# Patient Record
Sex: Male | Born: 1965 | Race: White | Hispanic: No | Marital: Married | Smoking: Never smoker
Health system: Southern US, Community
[De-identification: ages and names within clinical notes are randomized; demographics above are authoritative.]

## PROBLEM LIST (undated history)

## (undated) DIAGNOSIS — S4992XA Unspecified injury of left shoulder and upper arm, initial encounter: Secondary | ICD-10-CM

## (undated) DIAGNOSIS — Z8379 Family history of other diseases of the digestive system: Secondary | ICD-10-CM

## (undated) DIAGNOSIS — I10 Essential (primary) hypertension: Secondary | ICD-10-CM

## (undated) HISTORY — PX: TONSILLECTOMY: SUR1361

## (undated) HISTORY — DX: Essential (primary) hypertension: I10

## (undated) HISTORY — DX: Unspecified injury of left shoulder and upper arm, initial encounter: S49.92XA

## (undated) HISTORY — PX: NO PAST SURGERIES: SHX2092

## (undated) HISTORY — DX: Family history of other diseases of the digestive system: Z83.79

---

## 2005-03-20 ENCOUNTER — Ambulatory Visit: Payer: Self-pay | Admitting: Pulmonary Disease

## 2006-03-26 ENCOUNTER — Ambulatory Visit: Payer: Self-pay | Admitting: Pulmonary Disease

## 2006-04-01 ENCOUNTER — Ambulatory Visit: Payer: Self-pay | Admitting: Pulmonary Disease

## 2007-04-21 ENCOUNTER — Ambulatory Visit: Payer: Self-pay | Admitting: Pulmonary Disease

## 2007-04-21 LAB — CONVERTED CEMR LAB
ALT: 36 units/L (ref 0–53)
AST: 21 units/L (ref 0–37)
Albumin: 4.1 g/dL (ref 3.5–5.2)
Alkaline Phosphatase: 80 units/L (ref 39–117)
BUN: 10 mg/dL (ref 6–23)
Basophils Absolute: 0.1 10*3/uL (ref 0.0–0.1)
Basophils Relative: 1 % (ref 0.0–1.0)
Bilirubin Urine: NEGATIVE
Bilirubin, Direct: 0.1 mg/dL (ref 0.0–0.3)
CO2: 30 meq/L (ref 19–32)
Calcium: 9.2 mg/dL (ref 8.4–10.5)
Chloride: 105 meq/L (ref 96–112)
Cholesterol: 193 mg/dL (ref 0–200)
Creatinine, Ser: 0.9 mg/dL (ref 0.4–1.5)
Eosinophils Absolute: 0.1 10*3/uL (ref 0.0–0.6)
Eosinophils Relative: 1.4 % (ref 0.0–5.0)
GFR calc Af Amer: 120 mL/min
GFR calc non Af Amer: 99 mL/min
Glucose, Bld: 106 mg/dL — ABNORMAL HIGH (ref 70–99)
HCT: 44.4 % (ref 39.0–52.0)
HDL: 42.3 mg/dL (ref >39.0)
Hemoglobin, Urine: NEGATIVE
Hemoglobin: 15.4 g/dL (ref 13.0–17.0)
Ketones, ur: NEGATIVE mg/dL
LDL Cholesterol: 135 mg/dL — ABNORMAL HIGH (ref 0–99)
Leukocytes, UA: NEGATIVE
Lymphocytes Relative: 21.5 % (ref 12.0–46.0)
MCHC: 34.8 g/dL (ref 30.0–36.0)
MCV: 89.5 fL (ref 78.0–100.0)
Monocytes Absolute: 0.5 10*3/uL (ref 0.2–0.7)
Monocytes Relative: 8.1 % (ref 3.0–11.0)
Neutro Abs: 3.9 10*3/uL (ref 1.4–7.7)
Neutrophils Relative %: 68 % (ref 43.0–77.0)
Nitrite: NEGATIVE
Platelets: 251 10*3/uL (ref 150–400)
Potassium: 4.2 meq/L (ref 3.5–5.1)
RBC: 4.96 M/uL (ref 4.22–5.81)
RDW: 12.4 % (ref 11.5–14.6)
Sodium: 141 meq/L (ref 135–145)
Specific Gravity, Urine: 1.01 (ref 1.000–1.03)
TSH: 1.13 microintl units/mL (ref 0.35–5.50)
Total Bilirubin: 1.1 mg/dL (ref 0.3–1.2)
Total CHOL/HDL Ratio: 4.6
Total Protein: 6.8 g/dL (ref 6.0–8.3)
Triglycerides: 81 mg/dL (ref 0–149)
Urine Glucose: NEGATIVE mg/dL
Urobilinogen, UA: 0.2 (ref 0.0–1.0)
VLDL: 16 mg/dL (ref 0–40)
WBC: 5.8 10*3/uL (ref 4.5–10.5)
pH: 8.5 — AB (ref 5.0–8.0)

## 2007-12-09 ENCOUNTER — Telehealth: Payer: Self-pay | Admitting: Pulmonary Disease

## 2007-12-10 ENCOUNTER — Telehealth: Payer: Self-pay | Admitting: Pulmonary Disease

## 2007-12-22 DIAGNOSIS — J309 Allergic rhinitis, unspecified: Secondary | ICD-10-CM

## 2007-12-22 DIAGNOSIS — J45909 Unspecified asthma, uncomplicated: Secondary | ICD-10-CM | POA: Insufficient documentation

## 2007-12-22 DIAGNOSIS — K219 Gastro-esophageal reflux disease without esophagitis: Secondary | ICD-10-CM

## 2008-03-30 ENCOUNTER — Ambulatory Visit: Payer: Self-pay | Admitting: Pulmonary Disease

## 2009-04-20 ENCOUNTER — Ambulatory Visit: Payer: Self-pay | Admitting: Pulmonary Disease

## 2009-05-25 ENCOUNTER — Ambulatory Visit: Payer: Self-pay | Admitting: Pulmonary Disease

## 2009-05-25 DIAGNOSIS — S4980XA Other specified injuries of shoulder and upper arm, unspecified arm, initial encounter: Secondary | ICD-10-CM

## 2009-05-25 DIAGNOSIS — S46909A Unspecified injury of unspecified muscle, fascia and tendon at shoulder and upper arm level, unspecified arm, initial encounter: Secondary | ICD-10-CM | POA: Insufficient documentation

## 2009-06-06 ENCOUNTER — Telehealth: Payer: Self-pay | Admitting: Pulmonary Disease

## 2009-06-12 ENCOUNTER — Telehealth (INDEPENDENT_AMBULATORY_CARE_PROVIDER_SITE_OTHER): Payer: Self-pay | Admitting: *Deleted

## 2009-06-29 ENCOUNTER — Encounter: Payer: Self-pay | Admitting: Pulmonary Disease

## 2010-04-25 ENCOUNTER — Ambulatory Visit: Payer: Self-pay | Admitting: Pulmonary Disease

## 2010-11-05 ENCOUNTER — Ambulatory Visit
Admission: RE | Admit: 2010-11-05 | Discharge: 2010-11-05 | Payer: Self-pay | Source: Home / Self Care | Attending: Pulmonary Disease | Admitting: Pulmonary Disease

## 2010-11-05 ENCOUNTER — Other Ambulatory Visit: Payer: Self-pay | Admitting: Pulmonary Disease

## 2010-11-05 ENCOUNTER — Encounter: Payer: Self-pay | Admitting: Pulmonary Disease

## 2010-11-05 LAB — HEPATIC FUNCTION PANEL
ALT: 21 U/L (ref 0–53)
AST: 22 U/L (ref 0–37)
Albumin: 4.5 g/dL (ref 3.5–5.2)
Alkaline Phosphatase: 78 U/L (ref 39–117)
Bilirubin, Direct: 0.2 mg/dL (ref 0.0–0.3)
Total Bilirubin: 0.8 mg/dL (ref 0.3–1.2)
Total Protein: 6.9 g/dL (ref 6.0–8.3)

## 2010-11-05 LAB — URINALYSIS, ROUTINE W REFLEX MICROSCOPIC
Bilirubin Urine: NEGATIVE
Ketones, ur: NEGATIVE
Leukocytes, UA: NEGATIVE
Nitrite: NEGATIVE
Specific Gravity, Urine: 1.015 (ref 1.000–1.030)
Total Protein, Urine: NEGATIVE
Urine Glucose: NEGATIVE
Urobilinogen, UA: 0.2 (ref 0.0–1.0)
pH: 5.5 (ref 5.0–8.0)

## 2010-11-05 LAB — BASIC METABOLIC PANEL
BUN: 12 mg/dL (ref 6–23)
CO2: 30 mEq/L (ref 19–32)
Calcium: 9.6 mg/dL (ref 8.4–10.5)
Chloride: 103 mEq/L (ref 96–112)
Creatinine, Ser: 1 mg/dL (ref 0.4–1.5)
GFR: 84.77 mL/min (ref >60.00)
Glucose, Bld: 90 mg/dL (ref 70–99)
Potassium: 4.4 mEq/L (ref 3.5–5.1)
Sodium: 142 mEq/L (ref 135–145)

## 2010-11-05 LAB — CBC WITH DIFFERENTIAL/PLATELET
Basophils Absolute: 0 10*3/uL (ref 0.0–0.1)
Basophils Relative: 0.4 % (ref 0.0–3.0)
Eosinophils Absolute: 0.1 10*3/uL (ref 0.0–0.7)
Eosinophils Relative: 0.9 % (ref 0.0–5.0)
HCT: 43.8 % (ref 39.0–52.0)
Hemoglobin: 15.3 g/dL (ref 13.0–17.0)
Lymphocytes Relative: 23.8 % (ref 12.0–46.0)
Lymphs Abs: 1.4 10*3/uL (ref 0.7–4.0)
MCHC: 34.8 g/dL (ref 30.0–36.0)
MCV: 92.5 fl (ref 78.0–100.0)
Monocytes Absolute: 0.4 10*3/uL (ref 0.1–1.0)
Monocytes Relative: 6.7 % (ref 3.0–12.0)
Neutro Abs: 4.1 10*3/uL (ref 1.4–7.7)
Neutrophils Relative %: 68.2 % (ref 43.0–77.0)
Platelets: 197 10*3/uL (ref 150.0–400.0)
RBC: 4.74 Mil/uL (ref 4.22–5.81)
RDW: 12.7 % (ref 11.5–14.6)
WBC: 6 10*3/uL (ref 4.5–10.5)

## 2010-11-05 LAB — LIPID PANEL
Cholesterol: 162 mg/dL (ref 0–200)
HDL: 39.3 mg/dL (ref >39.00)
LDL Cholesterol: 103 mg/dL — ABNORMAL HIGH (ref 0–99)
Total CHOL/HDL Ratio: 4
Triglycerides: 100 mg/dL (ref 0.0–149.0)
VLDL: 20 mg/dL (ref 0.0–40.0)

## 2010-11-05 LAB — TSH: TSH: 0.99 u[IU]/mL (ref 0.35–5.50)

## 2010-11-17 LAB — CONVERTED CEMR LAB
ALT: 30 units/L (ref 0–53)
AST: 22 units/L (ref 0–37)
Albumin: 4.3 g/dL (ref 3.5–5.2)
Alkaline Phosphatase: 89 units/L (ref 39–117)
BUN: 12 mg/dL (ref 6–23)
Basophils Absolute: 0 10*3/uL (ref 0.0–0.1)
Basophils Relative: 0.3 % (ref 0.0–3.0)
Bilirubin Urine: NEGATIVE
Bilirubin, Direct: 0.2 mg/dL (ref 0.0–0.3)
CO2: 30 meq/L (ref 19–32)
Calcium: 9.4 mg/dL (ref 8.4–10.5)
Chloride: 107 meq/L (ref 96–112)
Cholesterol: 176 mg/dL (ref 0–200)
Creatinine, Ser: 1 mg/dL (ref 0.4–1.5)
Eosinophils Absolute: 0.1 10*3/uL (ref 0.0–0.7)
Eosinophils Relative: 1.1 % (ref 0.0–5.0)
GFR calc non Af Amer: 86.31 mL/min (ref >60)
Glucose, Bld: 94 mg/dL (ref 70–99)
HCT: 44 % (ref 39.0–52.0)
HDL: 40.8 mg/dL (ref >39.00)
Hemoglobin, Urine: NEGATIVE
Hemoglobin: 15.7 g/dL (ref 13.0–17.0)
Ketones, ur: NEGATIVE mg/dL
LDL Cholesterol: 117 mg/dL — ABNORMAL HIGH (ref 0–99)
Leukocytes, UA: NEGATIVE
Lymphocytes Relative: 23.8 % (ref 12.0–46.0)
Lymphs Abs: 1.4 10*3/uL (ref 0.7–4.0)
MCHC: 35.5 g/dL (ref 30.0–36.0)
MCV: 90.4 fL (ref 78.0–100.0)
Monocytes Absolute: 0.4 10*3/uL (ref 0.1–1.0)
Monocytes Relative: 6.9 % (ref 3.0–12.0)
Neutro Abs: 3.8 10*3/uL (ref 1.4–7.7)
Neutrophils Relative %: 67.9 % (ref 43.0–77.0)
Nitrite: NEGATIVE
Platelets: 207 10*3/uL (ref 150.0–400.0)
Potassium: 4.4 meq/L (ref 3.5–5.1)
RBC: 4.87 M/uL (ref 4.22–5.81)
RDW: 12.3 % (ref 11.5–14.6)
Sodium: 143 meq/L (ref 135–145)
Specific Gravity, Urine: 1.02 (ref 1.000–1.030)
TSH: 1.02 microintl units/mL (ref 0.35–5.50)
Total Bilirubin: 0.9 mg/dL (ref 0.3–1.2)
Total CHOL/HDL Ratio: 4
Total Protein, Urine: NEGATIVE mg/dL
Total Protein: 6.9 g/dL (ref 6.0–8.3)
Triglycerides: 92 mg/dL (ref 0.0–149.0)
Urine Glucose: NEGATIVE mg/dL
Urobilinogen, UA: 0.2 (ref 0.0–1.0)
VLDL: 18.4 mg/dL (ref 0.0–40.0)
WBC: 5.7 10*3/uL (ref 4.5–10.5)
pH: 6 (ref 5.0–8.0)

## 2010-11-19 NOTE — Assessment & Plan Note (Signed)
Summary: rov for asthma   CC:  1 yr f/u  - asthma well controlled - Denies sob, cough , and wheezing - Pt has questions about bee stings.  History of Present Illness: the pt comes in today for f/u of his known asthma.  He is doing very well on his current regimen, and reports no acute exacerbations since his last visit.  He has not required his rescue inhaler.  He is very active, and denies any breathing issues which impact QOL.  He was stung by insects recently with a rash and mild induration, but no breathing issues or facial swelling.  Current Medications (verified): 1)  Asmanex 60 Metered Doses 220 Mcg/inh Aepb (Mometasone Furoate) .... Inhale 1 Puff At Bedtime 2)  Albuterol 90 Mcg/act  Aers (Albuterol) .... Inhale 2 Puffs Every 4 Hours As Needed  Allergies (verified): No Known Drug Allergies  Review of Systems  The patient denies shortness of breath with activity, shortness of breath at rest, productive cough, non-productive cough, coughing up blood, chest pain, irregular heartbeats, acid heartburn, indigestion, loss of appetite, weight change, abdominal pain, difficulty swallowing, sore throat, tooth/dental problems, headaches, nasal congestion/difficulty breathing through nose, sneezing, itching, ear ache, anxiety, depression, hand/feet swelling, joint stiffness or pain, rash, change in color of mucus, and fever.    Vital Signs:  Patient profile:   45 year old male Height:      67 inches Weight:      187 pounds BMI:     29.39 O2 Sat:      99 % on Room air Pulse rate:   69 / minute BP sitting:   142 / 90  (right arm) Cuff size:   regular  Vitals Entered By: Abigail Miyamoto RN (April 25, 2010 3:53 PM)  O2 Flow:  Room air  Physical Exam  General:  wd male in nad Lungs:  totally clear to auscultation, no wheezing or rhonchi Heart:  rrr Extremities:  no edema or cyanosis Neurologic:  alert and oriented, moves all 4.   Impression & Recommendations:  Problem # 1:  ASTHMA  (ICD-493.90) the pt is doing well with his asthma regimen, and is having no management issues.  Will continue on his current meds.  Regarding his insect stings, I have reviewed what medications he can take if starts to have allergic reaction.  I have offered to call in an epi pen to have available, but it sounds like he did not have anaphylaxis at the time of his most recent issue.  Medications Added to Medication List This Visit: 1)  Albuterol 90 Mcg/act Aers (Albuterol) .... Inhale 2 puffs every 4 hours as needed  Other Orders: Est. Patient Level III (60454)  Patient Instructions: 1)  no change in asthma meds. 2)  would keep on hand to take benedryl 50mg  and zantac 300mg  if stung by insect.  Will call in epi pen if you would like to keep this on hand 3)  followup with me in one year

## 2010-11-21 NOTE — Letter (Signed)
Summary: Medical Exam Forms/DOT  Medical Exam Forms/DOT   Imported By: Sherian Rein 11/12/2010 12:52:01  _____________________________________________________________________  External Attachment:    Type:   Image     Comment:   External Document
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
## 2011-04-25 ENCOUNTER — Encounter: Payer: Self-pay | Admitting: Pulmonary Disease

## 2011-04-28 ENCOUNTER — Encounter: Payer: Self-pay | Admitting: Pulmonary Disease

## 2011-04-28 ENCOUNTER — Ambulatory Visit (INDEPENDENT_AMBULATORY_CARE_PROVIDER_SITE_OTHER): Payer: BC Managed Care – PPO | Admitting: Pulmonary Disease

## 2011-04-28 VITALS — BP 140/90 | HR 71 | Temp 98.4°F | Ht 67.0 in | Wt 192.4 lb

## 2011-04-28 DIAGNOSIS — J45909 Unspecified asthma, uncomplicated: Secondary | ICD-10-CM

## 2011-04-28 MED ORDER — MOMETASONE FUROATE 220 MCG/INH IN AEPB: 1.0000 | INHALATION_SPRAY | Freq: Every day | RESPIRATORY_TRACT | Status: DC

## 2011-04-28 NOTE — Progress Notes (Signed)
  Subjective:    Patient ID: Melvin Hall, male    DOB: Mar 31, 1966, 45 y.o.   MRN: [id number]  HPI The pt comes in today for f/u of his known asthma.  He is doing well on one inhalation of asmanex each HS.  He has not had any doe, and has not had to use his rescue inhaler.  No acute exacerbation or nocturnal symptoms since the last visit.  No issues with his medications.    Review of Systems  Constitutional: Negative for fever and unexpected weight change.  HENT: Negative for ear pain, nosebleeds, congestion, sore throat, rhinorrhea, sneezing, trouble swallowing, dental problem, postnasal drip and sinus pressure.   Eyes: Negative for redness and itching.  Respiratory: Negative for cough, chest tightness, shortness of breath and wheezing.   Cardiovascular: Negative for palpitations and leg swelling.  Gastrointestinal: Negative for nausea and vomiting.  Genitourinary: Negative for dysuria.  Musculoskeletal: Negative for joint swelling.  Skin: Negative for rash.  Neurological: Negative for headaches.  Hematological: Does not bruise/bleed easily.  Psychiatric/Behavioral: Negative for dysphoric mood. The patient is not nervous/anxious.        Objective:   Physical Exam Wd male in nad Nares without obvious discharge or purulence Chest totally clear to auscultation Cor with rrr LE without edema, no cyanosis noted.  Alert and oriented, moves all 4 extrem.        Assessment & Plan:

## 2011-04-28 NOTE — Patient Instructions (Signed)
Stay on current meds followup with me in one year.

## 2011-04-28 NOTE — Assessment & Plan Note (Addendum)
The pt is doing very well from an asthma standpoint on asmanex at HS.  He has not had an acute exacerbation, and has not required his rescue inhaler.  I have asked him to stay on his current meds, and to let us know if worsening symptoms.

## 2011-04-29 ENCOUNTER — Encounter: Payer: Self-pay | Admitting: Pulmonary Disease

## 2011-04-30 ENCOUNTER — Encounter: Payer: Self-pay | Admitting: Pulmonary Disease

## 2011-10-06 ENCOUNTER — Telehealth: Payer: Self-pay | Admitting: Pulmonary Disease

## 2011-10-06 NOTE — Telephone Encounter (Signed)
Error.  appt needed, not message.  Melvin Hall

## 2011-12-08 ENCOUNTER — Telehealth: Payer: Self-pay | Admitting: Pulmonary Disease

## 2011-12-08 NOTE — Telephone Encounter (Signed)
I spoke with spouse and advised her of Sn recs. She voiced her understanding and had no further questions

## 2011-12-08 NOTE — Telephone Encounter (Signed)
Pt c/o cough, yellow sinus drainage and occasional wheezing for several days. Seen at urgent care on Sun., 2/17 and was started on Zpak. Pt would like additional recs from SN and he is already scheduled to be seen on Fri., 2/22 for CPX. Pls advise.No Known Allergies

## 2011-12-08 NOTE — Telephone Encounter (Signed)
Per SN---ok for zpak #1  Take as directed with no refills, mucinex 600mg    2 po bid with plenty of fluids.  thanks

## 2011-12-12 ENCOUNTER — Ambulatory Visit (INDEPENDENT_AMBULATORY_CARE_PROVIDER_SITE_OTHER): Payer: BC Managed Care – PPO | Admitting: Pulmonary Disease

## 2011-12-12 ENCOUNTER — Ambulatory Visit (INDEPENDENT_AMBULATORY_CARE_PROVIDER_SITE_OTHER)
Admission: RE | Admit: 2011-12-12 | Discharge: 2011-12-12 | Disposition: A | Discharge status: Home / Self Care | Payer: BC Managed Care – PPO | Source: Ambulatory Visit | Attending: Pulmonary Disease | Admitting: Pulmonary Disease

## 2011-12-12 ENCOUNTER — Other Ambulatory Visit (INDEPENDENT_AMBULATORY_CARE_PROVIDER_SITE_OTHER): Payer: BC Managed Care – PPO

## 2011-12-12 ENCOUNTER — Encounter: Payer: Self-pay | Admitting: Pulmonary Disease

## 2011-12-12 VITALS — BP 142/88 | HR 71 | Temp 97.2°F | Ht 67.0 in | Wt 189.2 lb

## 2011-12-12 DIAGNOSIS — J45909 Unspecified asthma, uncomplicated: Secondary | ICD-10-CM

## 2011-12-12 DIAGNOSIS — J309 Allergic rhinitis, unspecified: Secondary | ICD-10-CM

## 2011-12-12 DIAGNOSIS — E785 Hyperlipidemia, unspecified: Secondary | ICD-10-CM

## 2011-12-12 DIAGNOSIS — Z0000 Encounter for general adult medical examination without abnormal findings: Secondary | ICD-10-CM

## 2011-12-12 DIAGNOSIS — K219 Gastro-esophageal reflux disease without esophagitis: Secondary | ICD-10-CM

## 2011-12-12 LAB — CBC WITH DIFFERENTIAL/PLATELET
Basophils Absolute: 0.1 10*3/uL (ref 0.0–0.1)
Eosinophils Absolute: 0.1 10*3/uL (ref 0.0–0.7)
HCT: 47 % (ref 39.0–52.0)
Hemoglobin: 15.9 g/dL (ref 13.0–17.0)
Lymphs Abs: 1.3 10*3/uL (ref 0.7–4.0)
MCHC: 33.7 g/dL (ref 30.0–36.0)
Neutro Abs: 7.1 10*3/uL (ref 1.4–7.7)
RDW: 12.4 % (ref 11.5–14.6)

## 2011-12-12 LAB — URINALYSIS
Nitrite: NEGATIVE
Specific Gravity, Urine: 1.02 (ref 1.000–1.030)
Total Protein, Urine: NEGATIVE
Urine Glucose: NEGATIVE
Urobilinogen, UA: 0.2 (ref 0.0–1.0)

## 2011-12-12 LAB — LIPID PANEL
HDL: 38.2 mg/dL — ABNORMAL LOW (ref >39.00)
Triglycerides: 117 mg/dL (ref 0.0–149.0)

## 2011-12-12 LAB — HEPATIC FUNCTION PANEL: Albumin: 4.3 g/dL (ref 3.5–5.2)

## 2011-12-12 LAB — BASIC METABOLIC PANEL
CO2: 29 mEq/L (ref 19–32)
Calcium: 9.4 mg/dL (ref 8.4–10.5)
Chloride: 102 mEq/L (ref 96–112)
Sodium: 137 mEq/L (ref 135–145)

## 2011-12-12 MED ORDER — PREDNISONE (PAK) 5 MG PO TABS: ORAL_TABLET | ORAL | Status: DC

## 2011-12-12 MED ORDER — METHYLPREDNISOLONE ACETATE 80 MG/ML IJ SUSP
80.0000 mg | Freq: Once | INTRAMUSCULAR | Status: AC
  Administered 2011-12-12: 80 mg via INTRAMUSCULAR

## 2011-12-12 NOTE — Patient Instructions (Signed)
Today we updated your med list in our EPIC system...    Continue your current medications the same...  For your Asthmatic Bronchitis:    We decided to give you a Depo shot 7 a prescription for a Prednisone dosepak (start tomorrow & follow directions)...    Don't forget the OTC MUCINEX 1-2 tabs twice daily w/ lots of fluids...  Today we did your follow up CXR & fasting blood work...    Please call the PHONE TREE in a few days for your results...    Dial [id number] & when prompted enter your patient number followed by the # symbol...    Your patient number is:  [phone]  Call for any questions...  Let's plan another check up in one years time.Marland KitchenMarland Kitchen
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
## 2011-12-13 ENCOUNTER — Encounter: Payer: Self-pay | Admitting: Pulmonary Disease

## 2012-05-07 ENCOUNTER — Ambulatory Visit: Payer: BC Managed Care – PPO | Admitting: Pulmonary Disease

## 2012-05-10 ENCOUNTER — Other Ambulatory Visit: Payer: Self-pay | Admitting: Pulmonary Disease

## 2012-05-18 ENCOUNTER — Ambulatory Visit (INDEPENDENT_AMBULATORY_CARE_PROVIDER_SITE_OTHER): Payer: BC Managed Care – PPO | Admitting: Pulmonary Disease

## 2012-05-18 ENCOUNTER — Encounter: Payer: Self-pay | Admitting: Pulmonary Disease

## 2012-05-18 VITALS — BP 136/98 | HR 67 | Temp 98.5°F | Ht 67.0 in | Wt 195.4 lb

## 2012-05-18 DIAGNOSIS — J45909 Unspecified asthma, uncomplicated: Secondary | ICD-10-CM

## 2012-05-18 MED ORDER — MOMETASONE FUROATE 220 MCG/INH IN AEPB: 1.0000 | INHALATION_SPRAY | Freq: Every day | RESPIRATORY_TRACT | Status: DC

## 2012-05-18 NOTE — Progress Notes (Signed)
  Subjective:    Patient ID: Melvin Hall, male    DOB: September 28, 1966, 46 y.o.   MRN: [id number]  HPI The patient comes in today for followup of his known asthma.  He is on low-dose asmanex at bedtime, and has done very well with this medication.  He has had no acute exacerbations, and has not required a rescue inhaler since his last visit.  He denies any significant cough or mucus production.   Review of Systems  Constitutional: Negative for fever and unexpected weight change.  HENT: Negative for ear pain, nosebleeds, congestion, sore throat, rhinorrhea, sneezing, trouble swallowing, dental problem, postnasal drip and sinus pressure.   Eyes: Negative for redness and itching.  Respiratory: Negative for cough, chest tightness, shortness of breath and wheezing.   Cardiovascular: Negative for palpitations and leg swelling.  Gastrointestinal: Negative for nausea and vomiting.  Genitourinary: Negative for dysuria.  Musculoskeletal: Negative for joint swelling.  Skin: Negative for rash.  Neurological: Negative for headaches.  Hematological: Does not bruise/bleed easily.  Psychiatric/Behavioral: Negative for dysphoric mood. The patient is not nervous/anxious.   All other systems reviewed and are negative.       Objective:   Physical Exam Well-developed male in no acute distress Nose without purulent discharge noted Chest totally clear to auscultation, no wheezing Cardiac exam with regular rate and rhythm Lower extremities without edema, no cyanosis Alert and oriented, moves all 4 extremities.       Assessment & Plan:

## 2012-05-18 NOTE — Assessment & Plan Note (Signed)
The patient is doing very well from an asthma standpoint on his low-dose medication.  He has not required rescue inhaler use, nor has he had acute exacerbation.  I have asked him to continue on his current meds, and to followup with me in one year.

## 2012-05-18 NOTE — Patient Instructions (Addendum)
No change in current medications followup with me in one year.  

## 2012-05-24 ENCOUNTER — Telehealth: Payer: Self-pay | Admitting: Pulmonary Disease

## 2012-05-24 NOTE — Telephone Encounter (Signed)
Left detailed message on VM advising rx was sent 05/18/12 for asmanex 220 mcg

## 2012-05-24 NOTE — Telephone Encounter (Signed)
I advised her tcb and ask for triage nurse

## 2012-05-24 NOTE — Telephone Encounter (Signed)
I spoke with pt wife and advised that I will call the pharmacy because the rx we sent was for asmanex 220. I called the pharmacy and asked they check the rx. They had entered asmanex , but the original rx was for . They have corrected this. Pt wife is aware. Carron Curie, CMA

## 2012-09-21 ENCOUNTER — Telehealth: Payer: Self-pay | Admitting: Pulmonary Disease

## 2012-09-21 ENCOUNTER — Telehealth: Payer: Self-pay | Admitting: *Deleted

## 2012-09-21 ENCOUNTER — Encounter: Payer: Self-pay | Admitting: Critical Care Medicine

## 2012-09-21 ENCOUNTER — Ambulatory Visit (INDEPENDENT_AMBULATORY_CARE_PROVIDER_SITE_OTHER): Payer: BC Managed Care – PPO | Admitting: Critical Care Medicine

## 2012-09-21 VITALS — BP 160/90 | HR 80 | Temp 99.7°F | Ht 67.0 in | Wt 191.4 lb

## 2012-09-21 DIAGNOSIS — J019 Acute sinusitis, unspecified: Secondary | ICD-10-CM

## 2012-09-21 DIAGNOSIS — J45909 Unspecified asthma, uncomplicated: Secondary | ICD-10-CM

## 2012-09-21 MED ORDER — MOMETASONE FUROATE 50 MCG/ACT NA SUSP: 2.0000 | Freq: Every day | NASAL | Status: DC

## 2012-09-21 MED ORDER — CEFDINIR 300 MG PO CAPS: 300.0000 mg | ORAL_CAPSULE | Freq: Two times a day (BID) | ORAL | Status: DC

## 2012-09-21 NOTE — Telephone Encounter (Signed)
lmomtcb for the pt.  SN did fill out the new card for the pt---but these forms are only good for 2 years from the date on the forms which is 11/05/2010.  These forms will need to be completed again since they do run out 11/05/2012.  Pt will need to schedule an appt to have these forms completed by SN.  Waiting for pt to call me back.

## 2012-09-21 NOTE — Assessment & Plan Note (Signed)
Acute sinusitis Review asthma assessment

## 2012-09-21 NOTE — Telephone Encounter (Signed)
During OV with Dr. Delford Field, pt brought in forms and letter from DOT.  Letter from DOT states the Medical examiners certifcate card needs to be completed with an issue date and an expiration date.  Pt states Dr. Kriste Basque filled out the card for him  in the past but the expiration date was not put on it.  He states they have the Medical Examination Report at home already filled out by SN.  States they spoke with Mindy yesterday and was advise to bring this by for SN to complete.  However, I do not see a msg put in regarding this.  I spoke with Leigh and showed her these forms and card.  Leigh, pls advise.  Thank you.

## 2012-09-21 NOTE — Progress Notes (Signed)
  Subjective:    Patient ID: Melvin Hall, male    DOB: 03/22/1966, 46 y.o.   MRN: [id number]  HPI  09/21/2012 Acute OV [facility] asthma pt. H of sinus pressure, aches, fever, sinus pressure.  Pt notes no change in dyspnea.  No chest pain. No SABA use. No qhs dyspnea.    Review of Systems  Constitutional: Positive for fever, chills and fatigue. Negative for unexpected weight change.  HENT: Positive for congestion and sinus pressure. Negative for ear pain, nosebleeds, sore throat, rhinorrhea, sneezing, trouble swallowing, dental problem and postnasal drip.   Eyes: Negative for redness and itching.  Respiratory: Negative for cough, chest tightness, shortness of breath and wheezing.   Cardiovascular: Negative for palpitations and leg swelling.  Gastrointestinal: Negative for nausea and vomiting.  Genitourinary: Negative for dysuria.  Musculoskeletal: Negative for joint swelling.  Skin: Negative for rash.  Neurological: Negative for headaches.  Hematological: Does not bruise/bleed easily.  Psychiatric/Behavioral: Negative for dysphoric mood. The patient is not nervous/anxious.   All other systems reviewed and are negative.       Objective:   Physical Exam  Filed Vitals:   09/21/12 1038  BP: 160/90  Pulse: 80  Temp: 99.7 F (37.6 C)  TempSrc: Oral  Height: 5\' 7"  (1.702 m)  Weight: 191 lb 6.4 oz (86.818 kg)  SpO2: 95%    Gen: Pleasant, well-nourished, in no distress,  normal affect  ENT: No lesions,  mouth clear,  oropharynx clear,+++postnasal drip, moderate bilateral nasal purulence  Neck: No JVD, no TMG, no carotid bruits  Lungs: No use of accessory muscles, no dullness to percussion, clear without rales or rhonchi  Cardiovascular: RRR, heart sounds normal, no murmur or gallops, no peripheral edema  Abdomen: soft and NT, no HSM,  BS normal  Musculoskeletal: No deformities, no cyanosis or clubbing  Neuro: alert, non focal  Skin: Warm, no lesions or rashes  No results  found.      Assessment & Plan:   ASTHMA Acute sinusitis with associated asthma flare Plan Start nasonex two puff daily each nostril Cefdinir 600mg  daily for 7days Stay on asmanex Use neil med sinus rinse for 7days 1-2 times daily Return as needed if unimproved  Acute sinusitis Acute sinusitis Review asthma assessment   Updated Medication List Outpatient Encounter Prescriptions as of 09/21/2012  Medication Sig Dispense Refill  . mometasone (ASMANEX 30 METERED DOSES) 220 MCG/INH inhaler Inhale 1 puff into the lungs daily.  1 Inhaler  11  . Pseudoeph-Doxylamine-DM-APAP (NYQUIL) 60-7.03-18-999 MG/30ML LIQD Take by mouth at bedtime.      . cefdinir (OMNICEF) 300 MG capsule Take 1 capsule (300 mg total) by mouth 2 (two) times daily.  14 capsule  0  . mometasone (NASONEX) 50 MCG/ACT nasal spray Place 2 sprays into the nose daily.  17 g  6

## 2012-09-21 NOTE — Assessment & Plan Note (Signed)
Acute sinusitis with associated asthma flare Plan Start nasonex two puff daily each nostril Cefdinir 600mg  daily for 7days Stay on asmanex Use neil med sinus rinse for 7days 1-2 times daily Return as needed if unimproved

## 2012-09-21 NOTE — Telephone Encounter (Signed)
Called spoke with patient's wife, who stated that pt has "horrible" cough, productive (though she does not know what color), fever and head congestion x2-3days, worse since yesterday.  Requesting ov today > [facility] w/ no openings.  TP is in [place] this morning, but pt lives in [place].  OV with PW this morning at 1045.  Pt wife aware to seek emergency assistance if needed prior to appt.  Last ov w/ [facility] 7.30.13, follow up in 1 year.

## 2012-09-21 NOTE — Patient Instructions (Addendum)
Start nasonex two puff daily each nostril Cefdinir 600mg  daily for 7days Stay on asmanex Use neil med sinus rinse for 7days 1-2 times daily Return as needed if unimproved

## 2012-09-21 NOTE — Telephone Encounter (Signed)
Pt is aware that he will need to bring these forms back to the office so this card can be filled out properly.  Victorino Dike spoke with pt and he stated that he will fax these forms back to our office to the fax machine up front.  Will wait for these forms to come through.

## 2012-09-22 ENCOUNTER — Telehealth: Payer: Self-pay | Admitting: Pulmonary Disease

## 2012-09-22 NOTE — Telephone Encounter (Signed)
Called and spoke with pt and he is aware that the DOT card runs out 11/05/2012.  He stated that he will figure this out and see if he will be able to come in for an earlier appt or he will just keep his appt with SN in feb 2014.  Pt will call me back and let me know but i will sign off of this message at this time.  Pt is aware that these older forms will be mailed back to him.

## 2012-09-22 NOTE — Telephone Encounter (Signed)
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  i explained to the pt that on the forms that he dropped off it states every 2 years these have to be completed.  Pt is aware and nothing further is needed.

## 2012-09-22 NOTE — Telephone Encounter (Signed)
Will forward to Leigh to set up ov with SN since he has limited availability on his schedule, thanks

## 2012-09-22 NOTE — Telephone Encounter (Signed)
Pt returned call. [phone]. Melvin Hall

## 2012-10-24 ENCOUNTER — Ambulatory Visit: Payer: Self-pay | Admitting: Emergency Medicine

## 2012-12-14 ENCOUNTER — Other Ambulatory Visit (INDEPENDENT_AMBULATORY_CARE_PROVIDER_SITE_OTHER): Payer: BC Managed Care – PPO

## 2012-12-14 ENCOUNTER — Encounter: Payer: Self-pay | Admitting: Pulmonary Disease

## 2012-12-14 ENCOUNTER — Ambulatory Visit (INDEPENDENT_AMBULATORY_CARE_PROVIDER_SITE_OTHER): Payer: BC Managed Care – PPO | Admitting: Pulmonary Disease

## 2012-12-14 VITALS — BP 152/98 | HR 68 | Temp 98.3°F | Ht 67.0 in | Wt 190.4 lb

## 2012-12-14 DIAGNOSIS — Z0000 Encounter for general adult medical examination without abnormal findings: Secondary | ICD-10-CM

## 2012-12-14 LAB — HEPATIC FUNCTION PANEL
ALT: 33 U/L (ref 0–53)
Albumin: 4.4 g/dL (ref 3.5–5.2)
Total Bilirubin: 1 mg/dL (ref 0.3–1.2)
Total Protein: 7.2 g/dL (ref 6.0–8.3)

## 2012-12-14 LAB — CBC WITH DIFFERENTIAL/PLATELET
Basophils Relative: 0.2 % (ref 0.0–3.0)
Eosinophils Relative: 1.3 % (ref 0.0–5.0)
Lymphocytes Relative: 26.9 % (ref 12.0–46.0)
Monocytes Relative: 7 % (ref 3.0–12.0)
Neutrophils Relative %: 64.6 % (ref 43.0–77.0)
Platelets: 219 10*3/uL (ref 150.0–400.0)
RBC: 5.17 Mil/uL (ref 4.22–5.81)
WBC: 5.6 10*3/uL (ref 4.5–10.5)

## 2012-12-14 LAB — BASIC METABOLIC PANEL
BUN: 13 mg/dL (ref 6–23)
Chloride: 102 mEq/L (ref 96–112)
Creatinine, Ser: 1 mg/dL (ref 0.4–1.5)
Glucose, Bld: 94 mg/dL (ref 70–99)

## 2012-12-14 LAB — URINALYSIS
Leukocytes, UA: NEGATIVE
Nitrite: NEGATIVE
Total Protein, Urine: NEGATIVE
pH: 6 (ref 5.0–8.0)

## 2012-12-14 LAB — TSH: TSH: 0.92 u[IU]/mL (ref 0.35–5.50)

## 2012-12-14 LAB — LIPID PANEL
Cholesterol: 186 mg/dL (ref 0–200)
LDL Cholesterol: 120 mg/dL — ABNORMAL HIGH (ref 0–99)
Triglycerides: 123 mg/dL (ref 0.0–149.0)

## 2012-12-14 MED ORDER — AMLODIPINE BESYLATE 5 MG PO TABS: 5.0000 mg | ORAL_TABLET | Freq: Every day | ORAL | Status: DC

## 2012-12-14 MED ORDER — CLONAZEPAM 0.5 MG PO TABS: ORAL_TABLET | ORAL | Status: DC
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
## 2012-12-14 NOTE — Patient Instructions (Addendum)
Today we updated your med list in our EPIC system...    Continue your current medications the same...    We decided to start AMLODIPINE 5mg  one tab daily for your BP...       Be sure to get a BP cuff at the drug store for home monitoring purposes...  We also wrote a newprescription for KLONOPIN 0.5mg  to take 1/2 to 1 tab up to twice daily as needed for the lumpy feeling in your throat etc...  Today we did your follow up FASTING blood work...    We will contact you w/ the resultys when avail...  Call for any questions...  Let's plan a brief ROV in about 3 months to recheck your BP.Marland KitchenMarland Kitchen

## 2012-12-18 ENCOUNTER — Encounter: Payer: Self-pay | Admitting: Pulmonary Disease

## 2012-12-20 NOTE — Progress Notes (Signed)
Quick Note:  Pt notified via MYCHART. ______ 

## 2012-12-20 NOTE — Telephone Encounter (Signed)
Lab results / recs as stated by SN:  Result Notes    Notes Recorded by Michele Mcalpine, MD on 12/17/2012 at 8:11 AM Please notify patient>  FLP ok but LDL=120 not at goal; Rec better diet, exercise, etc... Chems, CBC, Thyroid, Urine> all WNL.Marland KitchenMarland Kitchen   Pt notified via mychart Pt advised to contact office with any questions/concerns Will sign off

## 2012-12-23 ENCOUNTER — Telehealth: Payer: Self-pay | Admitting: Pulmonary Disease

## 2012-12-23 NOTE — Telephone Encounter (Signed)
Spoke with patients wife reiterated patients lab results. Verbalized understanding and nothing further needed at this time.

## 2013-01-30 ENCOUNTER — Encounter: Payer: Self-pay | Admitting: Pulmonary Disease

## 2013-02-07 ENCOUNTER — Telehealth: Payer: Self-pay | Admitting: Pulmonary Disease

## 2013-02-07 NOTE — Telephone Encounter (Signed)
Spoke with pharmacy and asmanex not covered on pts insurance.  And then called  And spoke with pts wife. She is going to look at formulary  And call back and let us know. Which meds is on his list.

## 2013-02-08 MED ORDER — BECLOMETHASONE DIPROPIONATE 80 MCG/ACT IN AERS: 2.0000 | INHALATION_SPRAY | Freq: Two times a day (BID) | RESPIRATORY_TRACT | Status: DC

## 2013-02-08 NOTE — Telephone Encounter (Signed)
Pt wife is aware of medication change. Rx will be sent to his pharmacy.

## 2013-02-08 NOTE — Telephone Encounter (Signed)
Spoke with Misty Stanley she spoke with [employer] and they gave her [facility] # for our office to call to complete PA for medication they did not give her the drugs on the formulary I called [facility] @ [phone] Patient is currently on Asmanex and because he has not tried Flovent, QVar or Pulmicort PA was denied (per rep these are probably the drugs that are covered) Spoke back with Misty Stanley to inform her of this. I stated I would send msg to Dr. Shelle Iron for his recommendations on this and our office would call back. Dr. Shelle Iron please advise, thank you

## 2013-02-08 NOTE — Telephone Encounter (Signed)
Let pt know insurance is not allowing him to be on asmanex anymore Would suggest qvar 80, but will need to take 2 puffs TWICE  A DAY.  Make sure he knows is twice a day!!  And needs to rinse well.  Let me know if issues with this medication.

## 2013-02-08 NOTE — Telephone Encounter (Signed)
ATC Lisa. No answer, LMOMTCB

## 2013-02-08 NOTE — Telephone Encounter (Signed)
Pt's wife called & states she has some info to pass on to the nurse & can be reached at [phone].  Melvin Hall

## 2013-02-17 ENCOUNTER — Telehealth: Payer: Self-pay | Admitting: Pulmonary Disease

## 2013-02-17 NOTE — Telephone Encounter (Signed)
Spoke with pt's spouse and notified of recs per [facility] She verbalized understanding  States that the pt does not want to take any more steroid inhalers since the qvar has steroid and he believes that he will not be able to tolerate I advised will check with [facility] and see if he feels a non steroid inhaler would be appropriate to tx his asthma Please advise thanks!

## 2013-02-17 NOTE — Telephone Encounter (Signed)
I spoke with spouse and she stated pt hs been on QVAR over a week now. He c/o muscle/joint aches/pains, feels tired and some vision changes. The spouse read about QVAR and this is some side effects of the QVAR. Pt was on asmanex prior but [employer] would not cover this. She is wanting to know since he can not tolerate QVAR if they would cover the asmanex now or if [facility] as any other recs. Pt has not taken the QVAR x yesterday. Please advise thanks.

## 2013-02-17 NOTE — Telephone Encounter (Signed)
I suspect these symptoms are not from qvar.  No they will not allow asmanex unless he fails other drugs. Would try pulmicort 2 puffs am and pm, needs to come by office for instruction on how to use, send in prescription (no samples).

## 2013-02-18 MED ORDER — MONTELUKAST SODIUM 10 MG PO TABS
10.0000 mg | ORAL_TABLET | Freq: Every day | ORAL | Status: DC
Start: 2013-02-18 — End: 2013-07-21

## 2013-02-18 NOTE — Telephone Encounter (Signed)
The treatment of choice for asthma since the mid 80's has been inhaled steroids.  There is no substitute for this!!  It is the gold standard.  Some pts respond to singulair, but it does not have the long term results of inhaled steroids I am willing to try him on singulair, but a reasonable chance it wont completely treat his asthma.

## 2013-02-18 NOTE — Telephone Encounter (Signed)
Spoke with Misty Stanley and notified of recs per [facility] She verbalized understanding Pt willing to try singlulair Rx was sent to pharm

## 2013-05-18 ENCOUNTER — Ambulatory Visit: Payer: BC Managed Care – PPO | Admitting: Pulmonary Disease

## 2013-05-20 ENCOUNTER — Ambulatory Visit: Payer: BC Managed Care – PPO | Admitting: Pulmonary Disease

## 2013-06-02 ENCOUNTER — Encounter: Payer: Self-pay | Admitting: Pulmonary Disease

## 2013-06-02 ENCOUNTER — Ambulatory Visit (INDEPENDENT_AMBULATORY_CARE_PROVIDER_SITE_OTHER): Payer: BC Managed Care – PPO | Admitting: Pulmonary Disease

## 2013-06-02 ENCOUNTER — Telehealth: Payer: Self-pay | Admitting: *Deleted

## 2013-06-02 ENCOUNTER — Telehealth: Payer: Self-pay | Admitting: Pulmonary Disease

## 2013-06-02 VITALS — BP 160/94 | HR 75 | Temp 98.1°F | Ht 66.0 in | Wt 195.4 lb

## 2013-06-02 DIAGNOSIS — Z23 Encounter for immunization: Secondary | ICD-10-CM

## 2013-06-02 DIAGNOSIS — J45909 Unspecified asthma, uncomplicated: Secondary | ICD-10-CM

## 2013-06-02 MED ORDER — LOSARTAN POTASSIUM-HCTZ 50-12.5 MG PO TABS: 1.0000 | ORAL_TABLET | Freq: Every day | ORAL | Status: DC

## 2013-06-02 NOTE — Progress Notes (Signed)
  Subjective:    Patient ID: Melvin Hall, male    DOB: 10-Nov-1965, 47 y.o.   MRN: [id number]  HPI The patient comes in today for followup of his known asthma.  He has done well in the past with inhaled corticosteroids, but most recently has not wanted to be on them.  He was started on Singulair, and feels that he has done very well on this.  He feels that his breathing is stable, and never requires his rescue inhaler.   Review of Systems  Constitutional: Negative for fever and unexpected weight change.  HENT: Negative for ear pain, nosebleeds, congestion, sore throat, rhinorrhea, sneezing, trouble swallowing, dental problem, postnasal drip and sinus pressure.   Eyes: Negative for redness and itching.  Respiratory: Negative for cough, chest tightness, shortness of breath and wheezing.   Cardiovascular: Negative for palpitations and leg swelling.  Gastrointestinal: Negative for nausea and vomiting.  Genitourinary: Negative for dysuria.  Musculoskeletal: Negative for joint swelling.  Skin: Negative for rash.  Neurological: Negative for headaches.  Hematological: Does not bruise/bleed easily.  Psychiatric/Behavioral: Negative for dysphoric mood. The patient is not nervous/anxious.        Objective:   Physical Exam Well-developed male in no acute distress Nose without purulence or discharge noted Neck without lymphadenopathy or thyromegaly Chest totally clear to auscultation, no wheezing Cardiac exam with regular rate and rhythm Lower extremities without edema, no cyanosis Alert and oriented, moves all 4 extremities.       Assessment & Plan:

## 2013-06-02 NOTE — Telephone Encounter (Signed)
Pt being seen today in office with Dr Shelle Iron to f/u Asthma.  Pt had increased B/P 160/94 Pt states that B/P has been increased for a few months. Taking Amlodipine 5mg  qd as rxed. Denies having HA. Would like to know if this needs to be increased, changed or if he needs OV with SN to follow up on blood pressure.   No Known Allergies New Pharmacy----  [facility] [place], [place]  Dr Kriste Basque please advise recs. Thanks.

## 2013-06-02 NOTE — Telephone Encounter (Signed)
Spoke with patients spouse-- Questioning why the addition to BP medication instead of increasing amlodipine I reiterated to patients spouse: Tommie Sams, CMA at 06/02/2013 10:26 AM   Status: Signed            Per SN: I don't rec increased amlodipine d/t increase in edema w/ the 10 mg dose. I rec adding generic hyzaar 50/12.5 mg 1 po QD. He needs ROV w/ SN in 4-6 weeks.    Spouse verbalilzed understanding and nothing further needed at this time

## 2013-06-02 NOTE — Patient Instructions (Addendum)
Stay on singulair, but call if you are having more breathing issues.  followup with me in one year if doing well.

## 2013-06-02 NOTE — Telephone Encounter (Signed)
Per SN: I don't rec increased amlodipine d/t increase in edema w/ the 10 mg dose. I rec adding generic hyzaar 50/12.5 mg 1 po QD. He needs ROV w/ SN in 4-6 weeks.    I spoke with pt and is aware of recs. RX will be called in. Nothing further needed and appt scheduled

## 2013-06-02 NOTE — Assessment & Plan Note (Addendum)
The patient has wanted to stay off inhalants corticosteroids if at all possible, and has been on Singulair with good success so far.  He feels that his breathing is stable, and never uses his rescue inhaler.  His spirometry is normal today.

## 2013-06-10 ENCOUNTER — Encounter: Payer: Self-pay | Admitting: Pulmonary Disease

## 2013-06-30 ENCOUNTER — Ambulatory Visit (INDEPENDENT_AMBULATORY_CARE_PROVIDER_SITE_OTHER): Payer: BC Managed Care – PPO | Admitting: Pulmonary Disease

## 2013-06-30 ENCOUNTER — Encounter: Payer: Self-pay | Admitting: Pulmonary Disease

## 2013-06-30 VITALS — BP 148/80 | HR 74 | Temp 98.5°F | Ht 66.0 in | Wt 189.2 lb

## 2013-06-30 DIAGNOSIS — K219 Gastro-esophageal reflux disease without esophagitis: Secondary | ICD-10-CM

## 2013-06-30 DIAGNOSIS — E785 Hyperlipidemia, unspecified: Secondary | ICD-10-CM

## 2013-06-30 DIAGNOSIS — I10 Essential (primary) hypertension: Secondary | ICD-10-CM

## 2013-06-30 DIAGNOSIS — J45909 Unspecified asthma, uncomplicated: Secondary | ICD-10-CM

## 2013-06-30 NOTE — Patient Instructions (Addendum)
Today we updated your med list in our EPIC system...    Continue your current medications the same...  It is ok to adjust your once a day meds to take them all together at a time that is easiest for you to remember...    This is preferable to missing a medication 2-3d per week because you couldn't remember to take it...  Keep a detailed record of your home BP checks & e-mail me in 2-4 weeks w/ the results...    We can make any needed adjustment in meds via e-mail...  Call for any questions.Marland KitchenMarland Kitchen
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
## 2013-07-14 IMAGING — CR DG CHEST 2V
1 series · 2 of 2 positions shown · non-contrast
Comparison: none

REASON FOR EXAM: cough and chest congestion
COMMENTS:

PROCEDURE:     MDR - MDR CHEST PA(OR AP) AND LATERAL  - October 24, 2012  [DATE]
RESULT:     The lungs are clear. The cardiac silhouette and visualized bony
skeleton are unremarkable.

[Series 1: pa · 0.17mm/px · 2 of 2 slices shown]
[im 1/2]
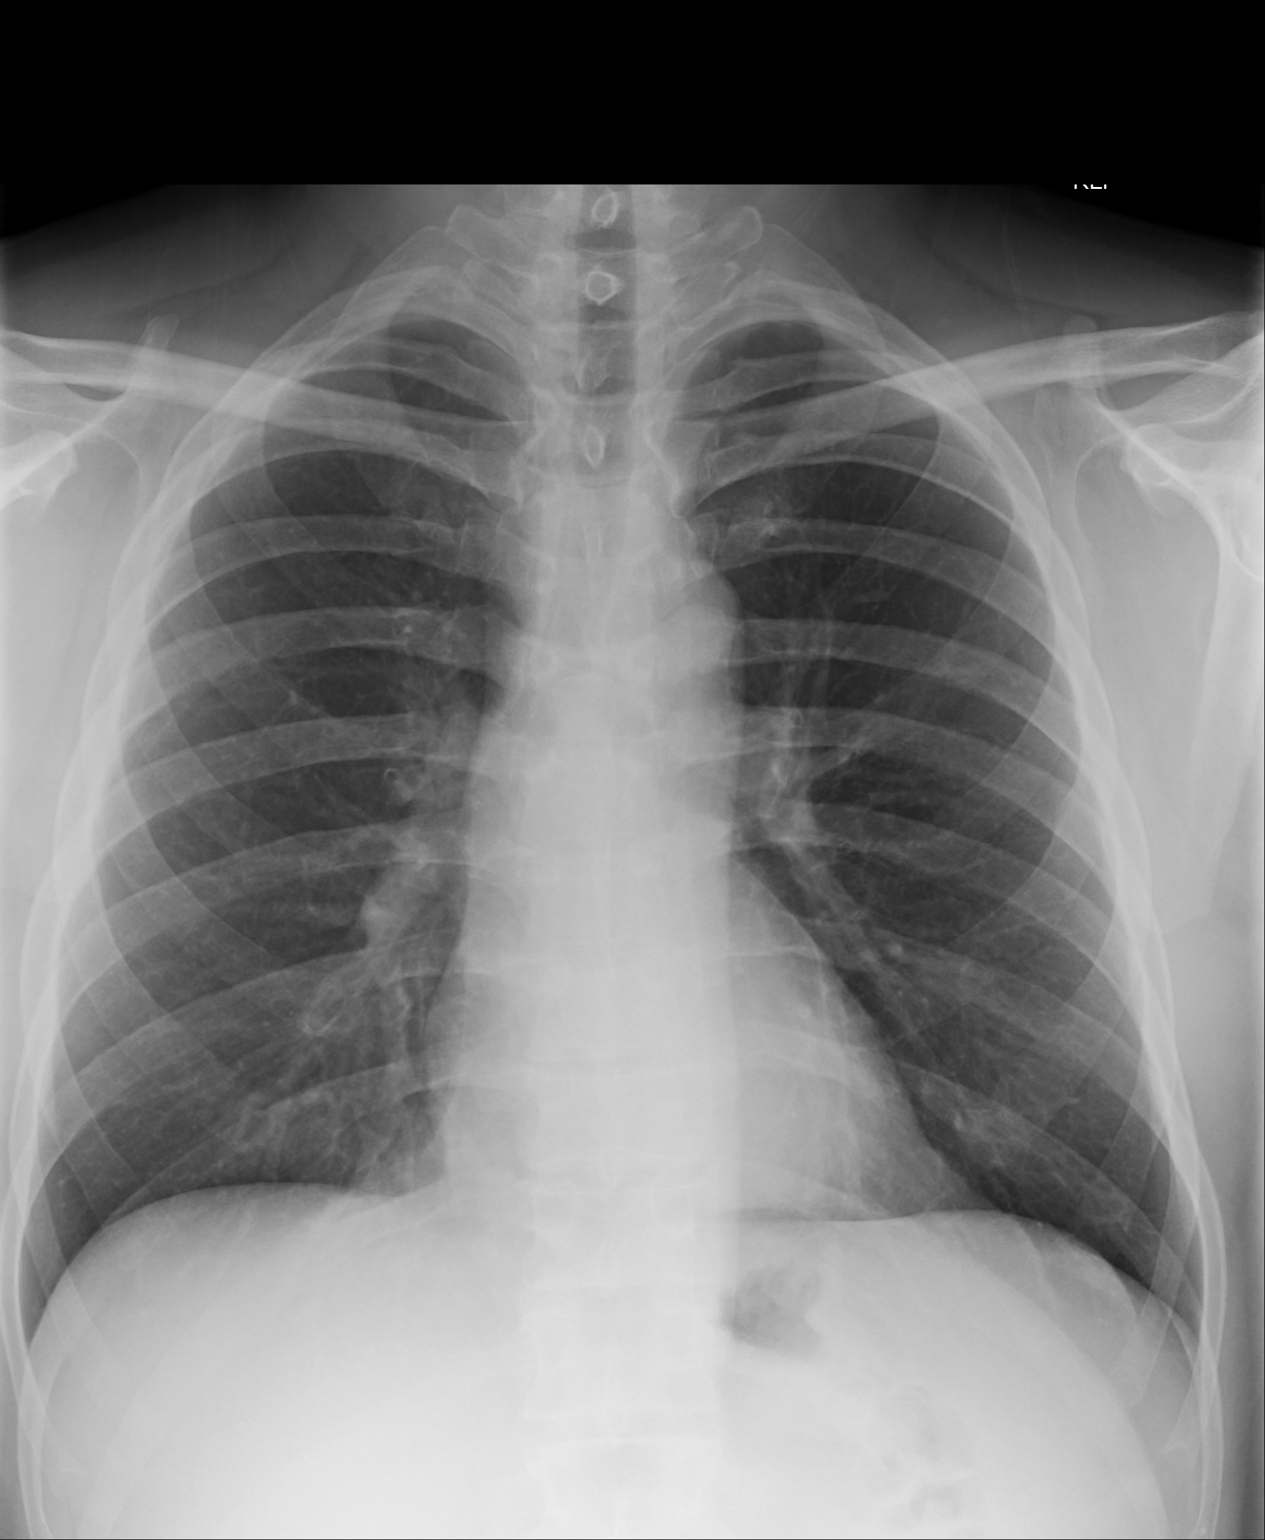
[im 2/2]
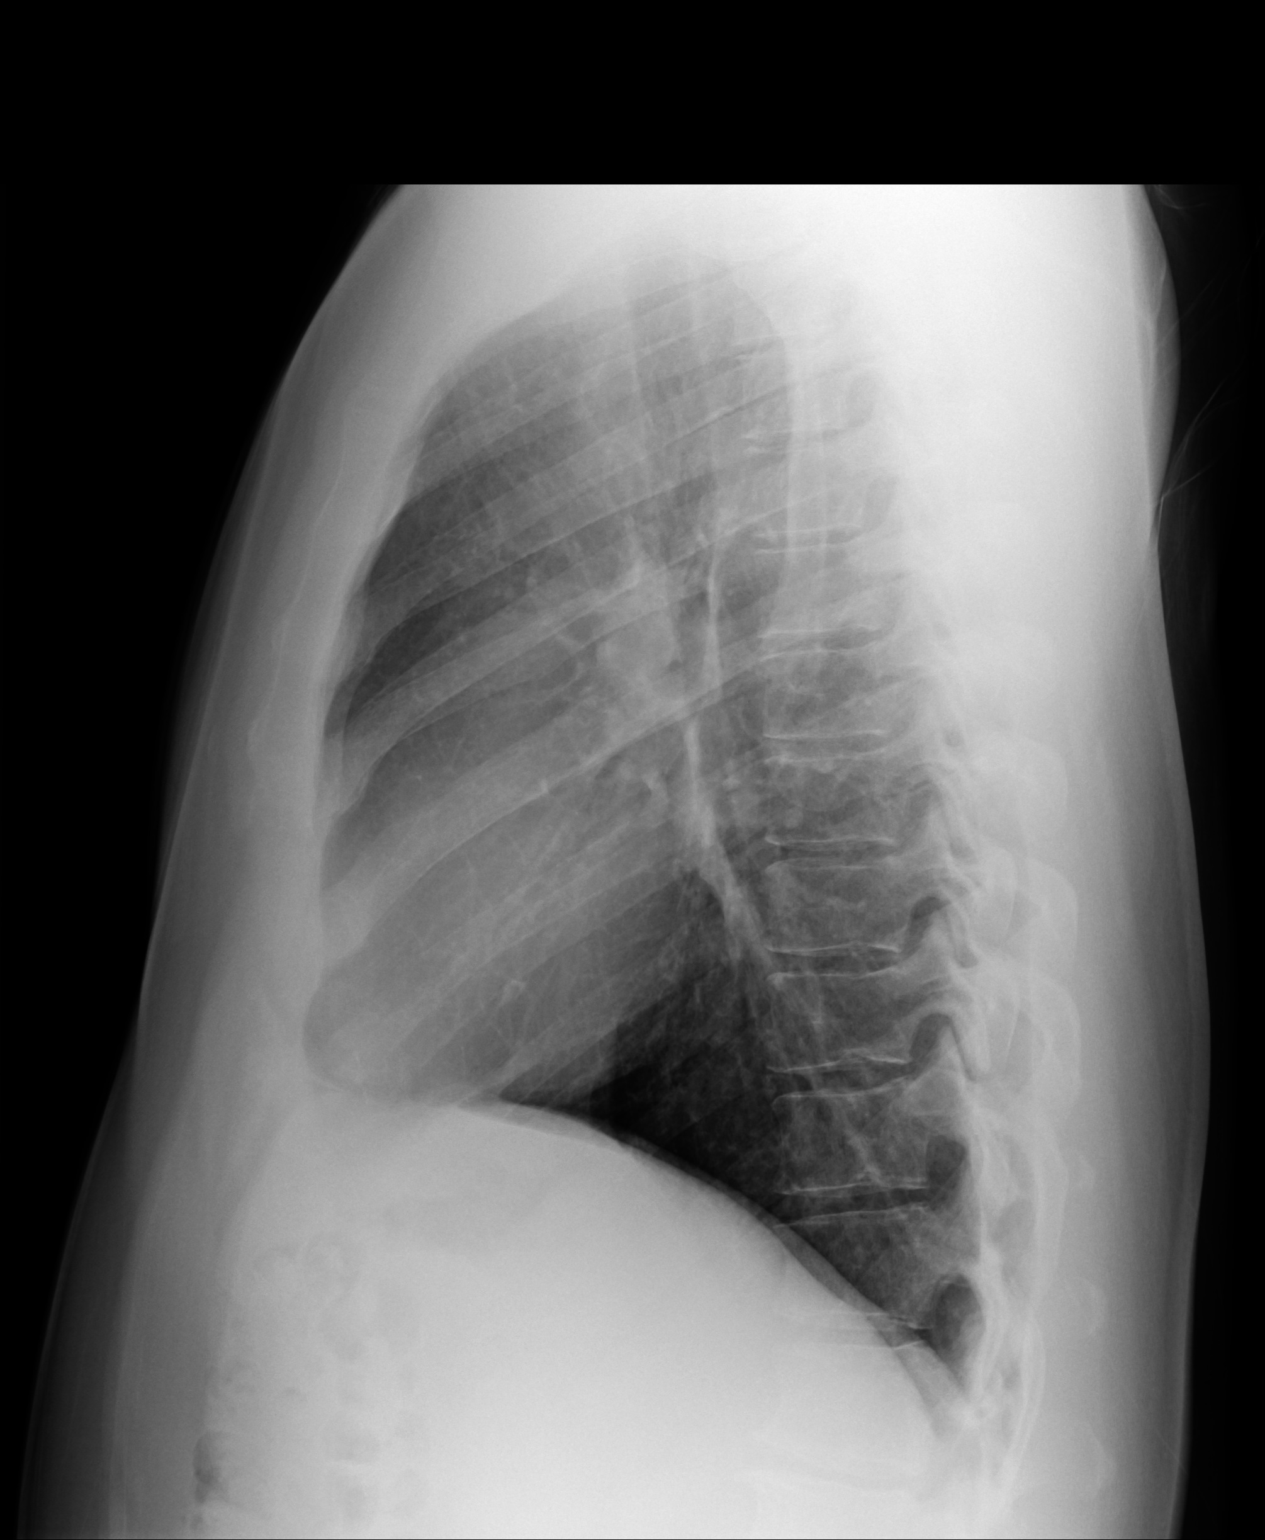

[2 of 2 positions shown; findings below may reference images not displayed]

IMPRESSION: 1. Chest radiograph without evidence of acute cardiopulmonary disease.

## 2013-07-21 ENCOUNTER — Other Ambulatory Visit: Payer: Self-pay | Admitting: Pulmonary Disease

## 2013-07-21 MED ORDER — MONTELUKAST SODIUM 10 MG PO TABS: 10.0000 mg | ORAL_TABLET | Freq: Every day | ORAL | Status: DC

## 2013-08-02 ENCOUNTER — Encounter: Payer: Self-pay | Admitting: Pulmonary Disease

## 2013-08-03 NOTE — Telephone Encounter (Signed)
[name] I will forward thsi to Dr. Kriste Basque to review. Carron Curie, CMA  Per Dr. Jodelle Green request, please find below my Blood Pressure readings as of my last visit:  07/01/13 9:50pm 129/76 07/02/13 8:30am 137/79 07/03/13 1:15pm 129/77 07/06/13 8:45pm 136/79 07/09/13 8:45pm 139/81 07/12/13 9:15pm 132/80 07/17/13 8:15am 135/77  Thank you, Melvin Hall

## 2013-08-04 ENCOUNTER — Other Ambulatory Visit: Payer: Self-pay | Admitting: Pulmonary Disease

## 2013-08-04 MED ORDER — AMLODIPINE BESYLATE 5 MG PO TABS: 5.0000 mg | ORAL_TABLET | Freq: Every day | ORAL | Status: DC

## 2013-08-04 MED ORDER — LOSARTAN POTASSIUM-HCTZ 100-12.5 MG PO TABS: 1.0000 | ORAL_TABLET | Freq: Every day | ORAL | Status: DC

## 2013-08-04 NOTE — Telephone Encounter (Signed)
Please advise SN thanks 

## 2013-08-25 ENCOUNTER — Other Ambulatory Visit: Payer: Self-pay

## 2014-01-16 ENCOUNTER — Other Ambulatory Visit: Payer: Self-pay | Admitting: Pulmonary Disease

## 2014-02-13 ENCOUNTER — Other Ambulatory Visit: Payer: Self-pay | Admitting: Pulmonary Disease

## 2014-06-05 ENCOUNTER — Ambulatory Visit (INDEPENDENT_AMBULATORY_CARE_PROVIDER_SITE_OTHER): Payer: BC Managed Care – PPO | Admitting: Pulmonary Disease

## 2014-06-05 ENCOUNTER — Encounter: Payer: Self-pay | Admitting: Pulmonary Disease

## 2014-06-05 VITALS — BP 160/92 | HR 71 | Temp 98.1°F | Ht 67.0 in | Wt 192.0 lb

## 2014-06-05 DIAGNOSIS — J309 Allergic rhinitis, unspecified: Secondary | ICD-10-CM

## 2014-06-05 DIAGNOSIS — J45909 Unspecified asthma, uncomplicated: Secondary | ICD-10-CM

## 2014-06-05 NOTE — Progress Notes (Signed)
   Subjective:    Patient ID: Melvin Hall, male    DOB: 06/29/1966, 48 y.o.   MRN: [id number]  HPI The patient comes in today for followup of his known asthma. He is been treated with Singulair alone because of his desire to stay off inhaled steroids. He has been doing very well on this, and his spirometry last year actually showed an increase in his FEV1. He has not had an acute exacerbation since his last visit, and rarely uses his rescue inhaler. He denies any significant cough, chest congestion, or mucus.   Review of Systems  Constitutional: Negative for fever and unexpected weight change.  HENT: Negative for congestion, dental problem, ear pain, nosebleeds, postnasal drip, rhinorrhea, sinus pressure, sneezing, sore throat and trouble swallowing.   Eyes: Negative for redness and itching.  Respiratory: Negative for cough, chest tightness, shortness of breath and wheezing.   Cardiovascular: Negative for palpitations and leg swelling.  Gastrointestinal: Negative for nausea and vomiting.  Genitourinary: Negative for dysuria.  Musculoskeletal: Negative for joint swelling.  Skin: Negative for rash.  Neurological: Negative for headaches.  Hematological: Does not bruise/bleed easily.  Psychiatric/Behavioral: Negative for dysphoric mood. The patient is not nervous/anxious.        Objective:   Physical Exam Well-developed male in no acute distress Nose without purulence or discharge noted Neck without lymphadenopathy or thyromegaly Chest with clear breath sounds bilaterally, no wheezing Cardiac exam with regular rate and rhythm Lower extremities without edema, no cyanosis Alert and oriented, moves all 4 extremities.       Assessment & Plan:

## 2014-06-05 NOTE — Assessment & Plan Note (Signed)
The patient is doing very well from an asthma standpoint, with no acute exacerbation, increased symptoms, or need for rescue inhaler. I have asked him to continue on his Singulair with as needed albuterol, and to call me if he has increasing symptoms. We will do spirometry at his next visit.

## 2014-06-05 NOTE — Patient Instructions (Signed)
No change in breathing medications.  Make sure you keep a rescue inhaler available. Will see you back in one year, and if doing well, will spread out to every 2 yrs.

## 2014-06-07 NOTE — Addendum Note (Signed)
Addended by: Marcellus ScottADKINS, CONNIE L on: 06/07/2014 11:33 AM   Modules accepted: Orders

## 2014-06-20 ENCOUNTER — Other Ambulatory Visit: Payer: Self-pay | Admitting: *Deleted

## 2014-06-20 MED ORDER — MONTELUKAST SODIUM 10 MG PO TABS: ORAL_TABLET | ORAL | Status: DC

## 2014-08-04 ENCOUNTER — Other Ambulatory Visit: Payer: Self-pay | Admitting: Pulmonary Disease

## 2014-09-22 ENCOUNTER — Encounter: Payer: Self-pay | Admitting: Family Medicine

## 2014-09-22 ENCOUNTER — Ambulatory Visit (INDEPENDENT_AMBULATORY_CARE_PROVIDER_SITE_OTHER): Payer: BC Managed Care – PPO | Admitting: Family Medicine

## 2014-09-22 VITALS — BP 140/84 | HR 64 | Temp 98.0°F | Ht 68.0 in | Wt 191.5 lb

## 2014-09-22 DIAGNOSIS — Z0000 Encounter for general adult medical examination without abnormal findings: Secondary | ICD-10-CM

## 2014-09-22 DIAGNOSIS — J4540 Moderate persistent asthma, uncomplicated: Secondary | ICD-10-CM

## 2014-09-22 DIAGNOSIS — I10 Essential (primary) hypertension: Secondary | ICD-10-CM

## 2014-09-22 MED ORDER — MONTELUKAST SODIUM 10 MG PO TABS: ORAL_TABLET | ORAL | Status: DC

## 2014-09-22 MED ORDER — AMLODIPINE BESYLATE 5 MG PO TABS: 5.0000 mg | ORAL_TABLET | Freq: Every day | ORAL | Status: DC

## 2014-09-22 NOTE — Assessment & Plan Note (Signed)
Well controlled on current regimen. Continue amlodipine, refilled today.

## 2014-09-22 NOTE — Assessment & Plan Note (Signed)
Well controlled on singulair. Followed by Dr Kriste BasqueNadel.

## 2014-09-22 NOTE — Patient Instructions (Addendum)
Blood work today to check kidneys and sugar. I've refilled medications today. Good to meet you. Return as needed or in 1 year for next physical.

## 2014-09-22 NOTE — Progress Notes (Signed)
BP 140/84 mmHg  Pulse 64  Temp(Src) 98 F (36.7 C) (Oral)  Ht 5\' 8"  (1.727 m)  Wt 191 lb 8 oz (86.864 kg)  BMI 29.12 kg/m2   CC: new pt to establish  Subjective:    Patient ID: Melvin Hall, male    DOB: 09/10/1966, 48 y.o.   MRN: [id number]  HPI: Melvin Hall is a 48 y.o. male presenting on 09/22/2014 for Establish Care   Prior saw Dr Kriste BasqueNadel. He has moderate persistent asthma and allergic rhinitis controlled on singulair and hypertension controlled on amlodipine 5mg  daily. Requests refill of both of these.  Preventative: Last CPE 11/2012 No fmhx colon or prostate cancer. No BM changes or blood in stool. Declines flu  Tdap 05/2013  Lives with wife. 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 Interim medical history since our last visit reviewed. Allergies and medications reviewed and updated.  No current outpatient prescriptions on file prior to visit.   No current facility-administered medications on file prior to visit.    Review of Systems  Constitutional: Negative for fever, chills, activity change, appetite change, fatigue and unexpected weight change.  HENT: Negative for hearing loss.   Eyes: Negative for visual disturbance.  Respiratory: Negative for cough, chest tightness, shortness of breath and wheezing.   Cardiovascular: Negative for chest pain, palpitations and leg swelling.  Gastrointestinal: Negative for nausea, vomiting, abdominal pain, diarrhea, constipation, blood in stool and abdominal distention.  Genitourinary: Negative for hematuria and difficulty urinating.  Musculoskeletal: Negative for myalgias, arthralgias and neck pain.  Skin: Negative for rash.  Neurological: Negative for dizziness, seizures, syncope and headaches.  Hematological:  Negative for adenopathy. Does not bruise/bleed easily.  Psychiatric/Behavioral: Negative for dysphoric mood. The patient is not nervous/anxious.    Per HPI unless specifically indicated above     Objective:    BP 140/84 mmHg  Pulse 64  Temp(Src) 98 F (36.7 C) (Oral)  Ht 5\' 8"  (1.727 m)  Wt 191 lb 8 oz (86.864 kg)  BMI 29.12 kg/m2  Wt Readings from Last 3 Encounters:  09/22/14 191 lb 8 oz (86.864 kg)  06/05/14 192 lb (87.091 kg)  06/30/13 189 lb 3.2 oz (85.821 kg)    Physical Exam  Constitutional: He is oriented to person, place, and time. He appears well-developed and well-nourished. No distress.  HENT:  Head: Normocephalic and atraumatic.  Right Ear: Hearing, tympanic membrane, external ear and ear canal normal.  Left Ear: Hearing, tympanic membrane, external ear and ear canal normal.  Nose: Nose normal.  Mouth/Throat: Uvula is midline, oropharynx is clear and moist and mucous membranes are normal. No oropharyngeal exudate, posterior oropharyngeal edema or posterior oropharyngeal erythema.  Eyes: Conjunctivae and EOM are normal. Pupils are equal, round, and reactive to light. No scleral icterus.  Neck: Normal range of motion. Neck supple. No thyromegaly present.  Cardiovascular: Normal rate, regular rhythm, normal heart sounds and intact distal pulses.   No murmur heard. Pulses:      Radial pulses are 2+ on the right side, and 2+ on the left side.  Pulmonary/Chest: Effort normal and breath sounds normal. No respiratory distress. He has no wheezes. He has no rales.  Abdominal: Soft. Bowel sounds are normal. He exhibits no distension and no mass. There is no tenderness. There is no rebound and no  guarding.  Musculoskeletal: Normal range of motion. He exhibits no edema.  Lymphadenopathy:    He has no cervical adenopathy.  Neurological: He is alert and oriented to person, place, and time.  CN grossly intact, station and gait intact  Skin: Skin is warm and dry. No rash noted.    Psychiatric: He has a normal mood and affect. His behavior is normal. Judgment and thought content normal.  Nursing note and vitals reviewed.      Assessment & Plan:   Problem List Items Addressed This Visit    Health maintenance examination - Primary    Preventative protocols reviewed and updated unless pt declined. Discussed healthy diet and lifestyle.    Extrinsic asthma    Well controlled on singulair. Followed by Dr Kriste BasqueNadel.    Relevant Medications      montelukast (SINGULAIR) 10 MG tablet   Essential hypertension, benign    Well controlled on current regimen. Continue amlodipine, refilled today.    Relevant Medications      amLODIpine (NORVASC) tablet   Other Relevant Orders      Basic metabolic panel       Follow up plan: Return in about 1 year (around 09/23/2015), or as needed, for annual exam, prior fasting for blood work.

## 2014-09-22 NOTE — Assessment & Plan Note (Signed)
Preventative protocols reviewed and updated unless pt declined. Discussed healthy diet and lifestyle.  

## 2014-09-22 NOTE — Progress Notes (Signed)
Pre visit review using our clinic review tool, if applicable. No additional management support is needed unless otherwise documented below in the visit note. 

## 2014-09-24 LAB — BASIC METABOLIC PANEL
BUN: 12 mg/dL (ref 6–23)
CHLORIDE: 103 meq/L (ref 96–112)
CO2: 31 meq/L (ref 19–32)
CREATININE: 1 mg/dL (ref 0.4–1.5)
Calcium: 9.7 mg/dL (ref 8.4–10.5)
GFR: 84.33 mL/min (ref >60.00)
Glucose, Bld: 89 mg/dL (ref 70–99)
POTASSIUM: 4.6 meq/L (ref 3.5–5.1)
Sodium: 141 mEq/L (ref 135–145)

## 2014-09-25 ENCOUNTER — Telehealth: Payer: Self-pay | Admitting: Pulmonary Disease

## 2014-09-25 NOTE — Telephone Encounter (Signed)
emmi emailed °

## 2014-10-02 ENCOUNTER — Encounter: Payer: Self-pay | Admitting: Family Medicine

## 2014-10-02 DIAGNOSIS — I10 Essential (primary) hypertension: Secondary | ICD-10-CM

## 2015-06-06 ENCOUNTER — Ambulatory Visit: Payer: BC Managed Care – PPO | Admitting: Pulmonary Disease

## 2015-06-11 ENCOUNTER — Ambulatory Visit: Payer: BC Managed Care – PPO | Admitting: Pulmonary Disease

## 2015-06-18 ENCOUNTER — Ambulatory Visit (INDEPENDENT_AMBULATORY_CARE_PROVIDER_SITE_OTHER): Payer: BC Managed Care – PPO | Admitting: Internal Medicine

## 2015-06-18 ENCOUNTER — Encounter: Payer: Self-pay | Admitting: Internal Medicine

## 2015-06-18 VITALS — BP 162/90 | HR 79 | Ht 66.0 in | Wt 195.0 lb

## 2015-06-18 DIAGNOSIS — J4530 Mild persistent asthma, uncomplicated: Secondary | ICD-10-CM

## 2015-06-18 DIAGNOSIS — I10 Essential (primary) hypertension: Secondary | ICD-10-CM | POA: Diagnosis not present

## 2015-06-18 MED ORDER — ALBUTEROL SULFATE HFA 108 (90 BASE) MCG/ACT IN AERS: INHALATION_SPRAY | RESPIRATORY_TRACT | Status: DC

## 2015-06-18 MED ORDER — ALBUTEROL SULFATE HFA 108 (90 BASE) MCG/ACT IN AERS: 2.0000 | INHALATION_SPRAY | Freq: Four times a day (QID) | RESPIRATORY_TRACT | Status: DC | PRN

## 2015-06-18 NOTE — Assessment & Plan Note (Addendum)
Melvin Hall 2002:  FEV1 2.58 (69%), ratio 64 Wants to avoid ICS Melvin Hall 2014:  FEV1 3.63 (100%), ratio76 on singulair  - spirometry 06/18/2015   FEV1 = wnl including fef 25-75 > ok to try off singulair and just use saba prn     The proper method of use, as well as anticipated side effects, of a metered-dose inhaler are discussed and demonstrated to the patient.  I had an extended discussion with the patient reviewing all relevant studies completed to date and  lasting 15 to 20 minutes of a 25 minute visit   1) with perfectly nl pfts and no symptoms at all it's hard to justify what amounts to lifelong commitment to prophylactic maint rx with singulair  2) ok to stop it therefore and restart with onset of any symptoms or need for rescue  3)  Reviewed rule of 2s:   If your breathing worsens or you need to use your rescue inhaler more than twice weekly or wake up more than twice a month with any respiratory symptoms or require more than two rescue inhalers per year, restart singulair  right away because this means we're not controlling the underlying problem (inflammation) adequately.     4) pulmonary f/u can be prn     Each maintenance medication was reviewed in detail including most importantly the difference between maintenance and prns and under what circumstances the prns are to be triggered using an action plan format that is not reflected in the computer generated alphabetically organized AVS.    Please see instructions for details which were reviewed in writing and the patient given a copy highlighting the part that I personally wrote and discussed at today's ov.

## 2015-06-18 NOTE — Progress Notes (Signed)
Patient ID: Melvin Hall, male   DOB: Jan 08, 1966,   MRN: [id number]   Brief patient profile:  32 yowm  Never  Smoker with h/o rhinitis in his early 15's then developed asthma apparently  resp to singulair and seen previously by [facility] yearly so f/u with Melvin Hall 06/18/2015    History of Present Illness  06/18/2015 1st   office visit/ Melvin Hall re: ? Chronic asthma/ rhinitis maint rx with singulair with ? "do I still need it" Chief Complaint  Patient presents with  . Follow-up    Pt states that his breathing is doing well. No new co's today.   he does not have any rescue inhaler as not had a problem "in years"    No obvious day to day or daytime variability or assoc chronic cough or cp or chest tightness, subjective wheeze or overt sinus or hb symptoms. No unusual exp hx or h/o childhood pna/ asthma or knowledge of premature birth.  Sleeping ok without nocturnal  or early am exacerbation  of respiratory  c/o's or need for noct saba. Also denies any obvious fluctuation of symptoms with weather or environmental changes or other aggravating or alleviating factors except as outlined above   Current Medications, Allergies, Complete Past Medical History, Past Surgical History, Family History, and Social History were reviewed in [employer] record.  ROS  The following are not active complaints unless bolded sore throat, dysphagia, dental problems, itching, sneezing,  nasal congestion or excess/ purulent secretions, ear ache,   fever, chills, sweats, unintended wt loss, classically pleuritic or exertional cp, hemoptysis,  orthopnea pnd or leg swelling, presyncope, palpitations, abdominal pain, anorexia, nausea, vomiting, diarrhea  or change in bowel or bladder habits, change in stools or urine, dysuria,hematuria,  rash, arthralgias, visual complaints, headache, numbness, weakness or ataxia or problems with walking or coordination,  change in mood/affect or memory.      Objective:   Physical Exam    amb nad    Wt Readings from Last 3 Encounters:  06/18/15 195 lb (88.451 kg)  09/22/14 191 lb 8 oz (86.864 kg)  06/05/14 192 lb (87.091 kg)    Vital signs reviewed - hbp noted       HEENT: nl dentition, turbinates, and orophanx. Nl external ear canals without cough reflex   NECK :  without JVD/Nodes/TM/ nl carotid upstrokes bilaterally   LUNGS: no acc muscle use, clear to A and P bilaterally without cough on insp or exp maneuvers   CV:  RRR  no s3 or murmur or increase in P2, no edema   ABD:  soft and nontender with nl excursion in the supine position. No bruits or organomegaly, bowel sounds nl  MS:  warm without deformities, calf tenderness, cyanosis or clubbing  SKIN: warm and dry without lesions    NEURO:  alert, approp, no deficits          Assessment:

## 2015-06-18 NOTE — Patient Instructions (Addendum)
Ok to try off singulair but if start noticing any increase in symptoms of cough/ wheeze/ shortness of breath or worse   nasal sympotms ok to restart singulair and let your primary refill it yearly  Only use your albuterol as a rescue medication to be used if you can't catch your breath by resting or doing a relaxed purse lip breathing pattern.  - The less you use it, the better it will work when you need it. - Ok to use up to 2 puffs  every 4 hours if you must but call for immediate appointment if use goes up over your usual need - Don't leave home without it !!  (think of it like the spare tire for your car)    If you are satisfied with your treatment plan,  let your doctor know and he/she can either refill your medications or you can return here when your prescription runs out.     If in any way you are not 100% satisfied,  please tell us.  If 100% better, tell your friends!  Pulmonary follow up is as needed   Late add : reviewed hbp/ goal of < 140 /90 and if not at goal increase norvasc to one twice daily until seen by primary care

## 2015-08-10 ENCOUNTER — Other Ambulatory Visit: Payer: Self-pay | Admitting: Family Medicine

## 2015-08-19 ENCOUNTER — Encounter: Payer: Self-pay | Admitting: Gynecology

## 2015-08-19 ENCOUNTER — Ambulatory Visit
Admission: EM | Admit: 2015-08-19 | Discharge: 2015-08-19 | Disposition: A | Discharge status: Home / Self Care | Payer: BC Managed Care – PPO | Attending: Family Medicine | Admitting: Family Medicine

## 2015-08-19 DIAGNOSIS — J0110 Acute frontal sinusitis, unspecified: Secondary | ICD-10-CM

## 2015-08-19 DIAGNOSIS — H6593 Unspecified nonsuppurative otitis media, bilateral: Secondary | ICD-10-CM

## 2015-08-19 DIAGNOSIS — B349 Viral infection, unspecified: Secondary | ICD-10-CM

## 2015-08-19 LAB — RAPID STREP SCREEN (MED CTR MEBANE ONLY): STREPTOCOCCUS, GROUP A SCREEN (DIRECT): NEGATIVE

## 2015-08-19 MED ORDER — FLUTICASONE PROPIONATE 50 MCG/ACT NA SUSP: 1.0000 | Freq: Two times a day (BID) | NASAL | Status: DC

## 2015-08-19 MED ORDER — AMOXICILLIN-POT CLAVULANATE 875-125 MG PO TABS: 1.0000 | ORAL_TABLET | Freq: Two times a day (BID) | ORAL | Status: DC

## 2015-08-19 MED ORDER — SALINE SPRAY 0.65 % NA SOLN: 2.0000 | NASAL | Status: DC

## 2015-08-19 NOTE — Discharge Instructions (Signed)
Viral Infections A virus is a type of germ. Viruses can cause:  Minor sore throats.  Aches and pains.  Headaches.  Runny nose.  Rashes.  Watery eyes.  Tiredness.  Coughs.  Loss of appetite.  Feeling sick to your stomach (nausea).  Throwing up (vomiting).  Watery poop (diarrhea). HOME CARE   Only take medicines as told by your doctor.  Drink enough water and fluids to keep your pee (urine) clear or pale yellow. Sports drinks are a good choice.  Get plenty of rest and eat healthy. Soups and broths with crackers or rice are fine. GET HELP RIGHT AWAY IF:   You have a very bad headache.  You have shortness of breath.  You have chest pain or neck pain.  You have an unusual rash.  You cannot stop throwing up.  You have watery poop that does not stop.  You cannot keep fluids down.  You or your child has a temperature by mouth above 102 F (38.9 C), not controlled by medicine.  Your baby is older than 3 months with a rectal temperature of 102 F (38.9 C) or higher.  Your baby is 45 months old or younger with a rectal temperature of 100.4 F (38 C) or higher. MAKE SURE YOU:   Understand these instructions.  Will watch this condition.  Will get help right away if you are not doing well or get worse.   This information is not intended to replace advice given to you by your health care provider. Make sure you discuss any questions you have with your health care provider.   Document Released: 09/18/2008 Document Revised: 12/29/2011 Document Reviewed: 03/14/2015 Elsevier Interactive Patient Education 2016 [employer]. Otitis Media With Effusion Otitis media with effusion is the presence of fluid in the middle ear. This is a common problem in children, which often follows ear infections. It may be present for weeks or longer after the infection. Unlike an acute ear infection, otitis media with effusion refers only to fluid behind the ear drum and not infection.  Children with repeated ear and sinus infections and allergy problems are the most likely to get otitis media with effusion. CAUSES  The most frequent cause of the fluid buildup is dysfunction of the eustachian tubes. These are the tubes that drain fluid in the ears to the back of the nose (nasopharynx). SYMPTOMS   The main symptom of this condition is hearing loss. As a result, you or your child may:  Listen to the TV at a loud volume.  Not respond to questions.  Ask "what" often when spoken to.  Mistake or confuse one sound or word for another.  There may be a sensation of fullness or pressure but usually not pain. DIAGNOSIS   Your health care provider will diagnose this condition by examining you or your child's ears.  Your health care provider may test the pressure in you or your child's ear with a tympanometer.  A hearing test may be conducted if the problem persists. TREATMENT   Treatment depends on the duration and the effects of the effusion.  Antibiotics, decongestants, nose drops, and cortisone-type drugs (tablets or nasal spray) may not be helpful.  Children with persistent ear effusions may have delayed language or behavioral problems. Children at risk for developmental delays in hearing, learning, and speech may require referral to a specialist earlier than children not at risk.  You or your child's health care provider may suggest a referral to an ear, nose,  and throat surgeon for treatment. The following may help restore normal hearing:  Drainage of fluid.  Placement of ear tubes (tympanostomy tubes).  Removal of adenoids (adenoidectomy). HOME CARE INSTRUCTIONS   Avoid secondhand smoke.  Infants who are breastfed are less likely to have this condition.  Avoid feeding infants while they are lying flat.  Avoid known environmental allergens.  Avoid people who are sick. SEEK MEDICAL CARE IF:   Hearing is not better in 3 months.  Hearing is worse.  Ear  pain.  Drainage from the ear.  Dizziness. MAKE SURE YOU:   Understand these instructions.  Will watch your condition.  Will get help right away if you are not doing well or get worse.   This information is not intended to replace advice given to you by your health care provider. Make sure you discuss any questions you have with your health care provider.   Document Released: 11/13/2004 Document Revised: 10/27/2014 Document Reviewed: 05/03/2013 Elsevier Interactive Patient Education 2016 Elsevier Inc. Sinusitis, Adult Sinusitis is redness, soreness, and inflammation of the paranasal sinuses. Paranasal sinuses are air pockets within the bones of your face. They are located beneath your eyes, in the middle of your forehead, and above your eyes. In healthy paranasal sinuses, mucus is able to drain out, and air is able to circulate through them by way of your nose. However, when your paranasal sinuses are inflamed, mucus and air can become trapped. This can allow bacteria and other germs to grow and cause infection. Sinusitis can develop quickly and last only a short time (acute) or continue over a long period (chronic). Sinusitis that lasts for more than 12 weeks is considered chronic. CAUSES Causes of sinusitis include:  Allergies.  Structural abnormalities, such as displacement of the cartilage that separates your nostrils (deviated septum), which can decrease the air flow through your nose and sinuses and affect sinus drainage.  Functional abnormalities, such as when the small hairs (cilia) that line your sinuses and help remove mucus do not work properly or are not present. SIGNS AND SYMPTOMS Symptoms of acute and chronic sinusitis are the same. The primary symptoms are pain and pressure around the affected sinuses. Other symptoms include:  Upper toothache.  Earache.  Headache.  Bad breath.  Decreased sense of smell and taste.  A cough, which worsens when you are lying  flat.  Fatigue.  Fever.  Thick drainage from your nose, which often is green and may contain pus (purulent).  Swelling and warmth over the affected sinuses. DIAGNOSIS Your health care provider will perform a physical exam. During your exam, your health care provider may perform any of the following to help determine if you have acute sinusitis or chronic sinusitis:  Look in your nose for signs of abnormal growths in your nostrils (nasal polyps).  Tap over the affected sinus to check for signs of infection.  View the inside of your sinuses using an imaging device that has a light attached (endoscope). If your health care provider suspects that you have chronic sinusitis, one or more of the following tests may be recommended:  Allergy tests.  Nasal culture. A sample of mucus is taken from your nose, sent to a lab, and screened for bacteria.  Nasal cytology. A sample of mucus is taken from your nose and examined by your health care provider to determine if your sinusitis is related to an allergy. TREATMENT Most cases of acute sinusitis are related to a viral infection and will resolve on  their own within 10 days. Sometimes, medicines are prescribed to help relieve symptoms of both acute and chronic sinusitis. These may include pain medicines, decongestants, nasal steroid sprays, or saline sprays. However, for sinusitis related to a bacterial infection, your health care provider will prescribe antibiotic medicines. These are medicines that will help kill the bacteria causing the infection. Rarely, sinusitis is caused by a fungal infection. In these cases, your health care provider will prescribe antifungal medicine. For some cases of chronic sinusitis, surgery is needed. Generally, these are cases in which sinusitis recurs more than 3 times per year, despite other treatments. HOME CARE INSTRUCTIONS  Drink plenty of water. Water helps thin the mucus so your sinuses can drain more  easily.  Use a humidifier.  Inhale steam 3-4 times a day (for example, sit in the bathroom with the shower running).  Apply a warm, moist washcloth to your face 3-4 times a day, or as directed by your health care provider.  Use saline nasal sprays to help moisten and clean your sinuses.  Take medicines only as directed by your health care provider.  If you were prescribed either an antibiotic or antifungal medicine, finish it all even if you start to feel better. SEEK IMMEDIATE MEDICAL CARE IF:  You have increasing pain or severe headaches.  You have nausea, vomiting, or drowsiness.  You have swelling around your face.  You have vision problems.  You have a stiff neck.  You have difficulty breathing.   This information is not intended to replace advice given to you by your health care provider. Make sure you discuss any questions you have with your health care provider.   Document Released: 10/06/2005 Document Revised: 10/27/2014 Document Reviewed: 10/21/2011 Elsevier Interactive Patient Education [employer]2016 [employer] Inc[employer]

## 2015-08-19 NOTE — ED Provider Notes (Addendum)
CSN: [id number]     Arrival date & time 08/19/15  4098 History   First MD Initiated Contact with Patient 08/19/15 [phone]     Chief Complaint  Patient presents with  . Facial Pain   (Consider location/radiation/quality/duration/timing/severity/associated sxs/prior Treatment) HPI Comments: Married caucasian male here with sinus headache, body aches, fever, chills, "my nose feels like concrete", cough worst at night, sleeping sitting in chair, using halls cough drops, has postnasal drainage, Temp 101 last night 99 this am.  Sudafed and tylenol cold and flu helping a little a little.  Decreased taste and appetite phlegm clear  Had pneumonia a couple years ago starting to feel like that.  Has rescue inhaler at home hasn't used denied wheezing or dyspnea  The history is provided by the patient.    Past Medical History  Diagnosis Date  . Allergic rhinitis   . Asthma   . Family history of GERD   . Injury of shoulder, left   . HTN (hypertension)    Past Surgical History  Procedure Laterality Date  . No past surgeries     Family History  Problem Relation Age of Onset  . Hypertension Father   . Kidney failure Father   . Arthritis Father   . Hypertension Sister   . Cancer Paternal Uncle     melanoma  . CAD Neg Hx   . Diabetes Neg Hx   . Stroke Neg Hx    Social History  Substance Use Topics  . Smoking status: Never Smoker   . Smokeless tobacco: Never Used  . Alcohol Use: No    Review of Systems  Constitutional: Positive for fever, chills and appetite change. Negative for diaphoresis, activity change and fatigue.  HENT: Positive for congestion, postnasal drip, sinus pressure and sore throat. Negative for dental problem, drooling, ear discharge, ear pain, facial swelling, hearing loss, mouth sores, nosebleeds, rhinorrhea, sneezing, tinnitus, trouble swallowing and voice change.   Eyes: Negative for photophobia, pain, discharge, redness, itching and visual disturbance.  Respiratory:  Positive for cough. Negative for choking, chest tightness, shortness of breath, wheezing and stridor.   Cardiovascular: Negative for chest pain, palpitations and leg swelling.  Gastrointestinal: Negative for nausea, vomiting, abdominal pain, diarrhea, constipation, blood in stool and abdominal distention.  Endocrine: Negative for cold intolerance and heat intolerance.  Genitourinary: Negative for dysuria.  Musculoskeletal: Positive for myalgias. Negative for back pain, joint swelling, arthralgias, gait problem, neck pain and neck stiffness.  Skin: Negative for color change, pallor, rash and wound.  Allergic/Immunologic: Positive for environmental allergies. Negative for food allergies.  Neurological: Positive for headaches. Negative for dizziness, tremors, seizures, syncope, facial asymmetry, speech difficulty, weakness, light-headedness and numbness.  Hematological: Negative for adenopathy. Does not bruise/bleed easily.  Psychiatric/Behavioral: Positive for sleep disturbance. Negative for behavioral problems, confusion and agitation.    Allergies  Review of patient's allergies indicates no known allergies.  Home Medications   Prior to Admission medications   Medication Sig Start Date End Date Taking? Authorizing Provider  DM-APAP-CPM (VICKS NYQUIL COLD & FLU NIGHT PO) Take by mouth.   Yes Historical Provider, MD  phenylephrine (SUDAFED PE) 10 MG TABS tablet Take 10 mg by mouth every 4 (four) hours as needed.   Yes Historical Provider, MD  albuterol (PROAIR HFA) 108 (90 BASE) MCG/ACT inhaler 2 puffs every 4 hours as needed only  if your can't catch your breath 06/18/15   Nyoka Cowden, MD  amLODipine (NORVASC) 5 MG tablet TAKE ONE TABLET EVERY DAY  08/10/15   Eustaquio Boyden, MD  amoxicillin-clavulanate (AUGMENTIN) 875-125 MG tablet Take 1 tablet by mouth every 12 (twelve) hours. 08/19/15   Barbaraann Barthel, NP  fluticasone (FLONASE) 50 MCG/ACT nasal spray Place 1 spray into both nostrils 2  (two) times daily. 08/19/15   Barbaraann Barthel, NP  sodium chloride (OCEAN) 0.65 % SOLN nasal spray Place 2 sprays into both nostrils every 2 (two) hours while awake. 08/19/15   Barbaraann Barthel, NP   Meds Ordered and Administered this Visit  Medications - No data to display  BP 158/81 mmHg  Pulse 78  Temp(Src) 98 F (36.7 C) (Oral)  Resp 18  Ht  (1.676 m)  Wt 185 lb (83.915 kg)  BMI 29.87 kg/m2  SpO2 99% No data found.   Physical Exam  Constitutional: He is oriented to person, place, and time. Vital signs are normal. He appears well-developed and well-nourished. He is active and cooperative.  Non-toxic appearance. He does not have a sickly appearance. He appears ill. No distress.  HENT:  Head: Normocephalic and atraumatic.  Right Ear: Hearing, external ear and ear canal normal. A middle ear effusion is present.  Left Ear: Hearing, external ear and ear canal normal. A middle ear effusion is present.  Nose: Mucosal edema present. No rhinorrhea, nose lacerations, sinus tenderness, nasal deformity, septal deviation or nasal septal hematoma. No epistaxis.  No foreign bodies. Right sinus exhibits no maxillary sinus tenderness and no frontal sinus tenderness. Left sinus exhibits no maxillary sinus tenderness and no frontal sinus tenderness.  Mouth/Throat: Uvula is midline and mucous membranes are normal. Mucous membranes are not pale, not dry and not cyanotic. He does not have dentures. No oral lesions. No trismus in the jaw. Normal dentition. No dental abscesses, uvula swelling, lacerations or dental caries. Posterior oropharyngeal edema and posterior oropharyngeal erythema present. No oropharyngeal exudate or tonsillar abscesses.  Cobblestoning posterior pharynx; bilateral TMs with air fluid level clear, bilateral nasal turbinates with edema/erythema; nasal congestion  Eyes: Conjunctivae, EOM and lids are normal. Pupils are equal, round, and reactive to light. Right eye exhibits no  chemosis, no discharge, no exudate and no hordeolum. No foreign body present in the right eye. Left eye exhibits no chemosis, no discharge, no exudate and no hordeolum. No foreign body present in the left eye. Right conjunctiva is not injected. Right conjunctiva has no hemorrhage. Left conjunctiva is not injected. Left conjunctiva has no hemorrhage. No scleral icterus. Right eye exhibits normal extraocular motion and no nystagmus. Left eye exhibits normal extraocular motion and no nystagmus. Right pupil is round and reactive. Left pupil is round and reactive. Pupils are equal.  Neck: Trachea normal and normal range of motion. Neck supple. No tracheal tenderness, no spinous process tenderness and no muscular tenderness present. No rigidity. No tracheal deviation, no edema, no erythema and normal range of motion present.  Cardiovascular: Normal rate, regular rhythm, S1 normal, S2 normal, normal heart sounds and intact distal pulses.  PMI is not displaced.  Exam reveals no gallop and no friction rub.   No murmur heard. Pulmonary/Chest: Effort normal and breath sounds normal. No accessory muscle usage or stridor. No respiratory distress. He has no decreased breath sounds. He has no wheezes. He has no rhonchi. He has no rales. He exhibits no tenderness.  1-2 nonproductive cough during exam; negative egophany all lung fields  Abdominal: Soft. He exhibits no distension.  Musculoskeletal: Normal range of motion. He exhibits no edema or tenderness.  Lymphadenopathy:  Head (right side): No submental, no submandibular, no tonsillar, no preauricular, no posterior auricular and no occipital adenopathy present.       Head (left side): No submental, no submandibular, no tonsillar, no preauricular, no posterior auricular and no occipital adenopathy present.    He has no cervical adenopathy.       Right cervical: No superficial cervical, no deep cervical and no posterior cervical adenopathy present.      Left  cervical: No superficial cervical, no deep cervical and no posterior cervical adenopathy present.  Neurological: He is alert and oriented to person, place, and time. He displays no atrophy and no tremor. No cranial nerve deficit or sensory deficit. He exhibits normal muscle tone. He displays no seizure activity. Coordination and gait normal. GCS eye subscore is 4. GCS verbal subscore is 5. GCS motor subscore is 6.  Skin: Skin is warm, dry and intact. No abrasion, no bruising, no burn, no ecchymosis, no laceration, no lesion, no petechiae and no rash noted. He is not diaphoretic. No cyanosis or erythema. No pallor. Nails show no clubbing.  Psychiatric: He has a normal mood and affect. His speech is normal and behavior is normal. Judgment and thought content normal. Cognition and memory are normal.  Nursing note and vitals reviewed.   ED Course  Procedures (including critical care time)  Labs Review Labs Reviewed  RAPID STREP SCREEN (NOT AT [facility])  CULTURE, GROUP A STREP ([facility] ONLY)    Imaging Review No results found.   MDM   1. Acute frontal sinusitis, recurrence not specified   2. Viral illness   3. Otitis media with effusion, bilateral    Retired does not need work note.  Start flonase 1 spray each nostril BID and nasal saline 2 sprays each nostril q2h while awake.  If no improvement in 48 hours start augmentin 875mg  po BID x 10 days.  Continue singulair 10mg  po daily. Stop sudafed as not helping.  May continue tylenol cold and flu if helping with fever and body aches or change over to acetaminophen 1000mg  po QID prn pain/fever.  No evidence of systemic bacterial infection, non toxic and well hydrated.  I do not see where any further testing or imaging is necessary at this time.   I will suggest supportive care, rest, good hygiene and encourage the patient to take adequate fluids.  The patient is to return to clinic or EMERGENCY ROOM if symptoms worsen or change significantly.  Exitcare  handout on sinusitis given to patient.  Patient verbalized agreement and understanding of treatment plan and had no further questions at this time.   P2:  Hand washing and cover cough  Patient notified rapid strep negative.  Will call with throat culture results once available or patient may call to talk with nurse on Tuesday 1 Nov.  Has Rx for augmentin 875mg  po BID x 10 days.  Suspect Viral illness: no evidence of invasive bacterial infection, non toxic and well hydrated.  This is most likely self limiting viral infection.  I do not see where any further testing or imaging is necessary at this time.   I will suggest supportive care, rest, good hygiene and encourage the patient to take adequate fluids.  Does not require work excuse.  Notified patient staff will call with culture results once available next 48+ hours.  Sudafed 30mg  po q4-6h prn; flonase 1 spray each nostril BID prn, nasal saline 1-2 sprays each nostril prn q2h, motrin 800mg  po TID prn.  Discussed honey with lemon and salt water gargles for comfort also.  The patient is to return to clinic or EMERGENCY ROOM if symptoms worsen or change significantly e.g. fever, lethargy, SOB, wheezing.  Exitcare handout on viral illness given to patient.  Patient verbalized agreement and understanding of treatment plan.    Supportive treatment.   No evidence of invasive bacterial infection, non toxic and well hydrated.  This is most likely self limiting viral infection.  I do not see where any further testing or imaging is necessary at this time.   I will suggest supportive care, rest, good hygiene and encourage the patient to take adequate fluids.  The patient is to return to clinic or EMERGENCY ROOM if symptoms worsen or change significantly e.g. ear pain, fever, purulent discharge from ears or bleeding.  Exitcare handout on otitis media with effusion given to patient.  Patient verbalized agreement and understanding of treatment plan.    Barbaraann Barthel,  NP 08/19/15 0920  24 Aug 2015 at 0802 Contacted patient via telephone notified throat culture normal/negative.  Patient reported less sinus congestion and drainage still has cough with clear phlegm denied fever/chills.  Did not take augmentin.  Using flonase and nasal saline.  Cough worst in am resolves with mint or cough drop.  Discussed may have cough for a couple weeks after URI.  Continue plan of care as previously discussed.  Start augmentin if fever, opaque sputum, worsening symptoms.  Patient verbalized understanding of information/instructions, agreed with plan of care and had no further questions at this time.  Barbaraann Barthel, NP 08/24/15 [phone]

## 2015-08-19 NOTE — ED Notes (Signed)
Patient c/o sinus infection. Per pt. Facial pressure / chest congestion / coughing x 2-3 weeks. Sore throat.

## 2015-08-21 LAB — CULTURE, GROUP A STREP (THRC)

## 2015-08-23 NOTE — ED Notes (Signed)
Final report of strep testing negative  

## 2015-09-19 ENCOUNTER — Other Ambulatory Visit: Payer: Self-pay | Admitting: Family Medicine

## 2015-09-19 DIAGNOSIS — I10 Essential (primary) hypertension: Secondary | ICD-10-CM

## 2015-09-20 ENCOUNTER — Other Ambulatory Visit: Payer: BC Managed Care – PPO

## 2015-09-20 ENCOUNTER — Other Ambulatory Visit (INDEPENDENT_AMBULATORY_CARE_PROVIDER_SITE_OTHER): Payer: BC Managed Care – PPO

## 2015-09-20 DIAGNOSIS — I10 Essential (primary) hypertension: Secondary | ICD-10-CM | POA: Diagnosis not present

## 2015-09-20 LAB — LIPID PANEL
CHOLESTEROL: 169 mg/dL (ref 0–200)
HDL: 41.9 mg/dL (ref >39.00)
LDL CALC: 107 mg/dL — AB (ref 0–99)
NONHDL: 127.15
Total CHOL/HDL Ratio: 4
Triglycerides: 100 mg/dL (ref 0.0–149.0)
VLDL: 20 mg/dL (ref 0.0–40.0)

## 2015-09-20 LAB — BASIC METABOLIC PANEL
BUN: 11 mg/dL (ref 6–23)
CALCIUM: 9.6 mg/dL (ref 8.4–10.5)
CO2: 31 meq/L (ref 19–32)
CREATININE: 1.01 mg/dL (ref 0.40–1.50)
Chloride: 103 mEq/L (ref 96–112)
GFR: 83.03 mL/min (ref >60.00)
GLUCOSE: 105 mg/dL — AB (ref 70–99)
POTASSIUM: 4.3 meq/L (ref 3.5–5.1)
SODIUM: 140 meq/L (ref 135–145)

## 2015-09-25 ENCOUNTER — Encounter: Payer: Self-pay | Admitting: Family Medicine

## 2015-09-25 ENCOUNTER — Ambulatory Visit (INDEPENDENT_AMBULATORY_CARE_PROVIDER_SITE_OTHER): Payer: BC Managed Care – PPO | Admitting: Family Medicine

## 2015-09-25 VITALS — BP 160/90 | HR 84 | Temp 98.8°F | Ht 68.0 in | Wt 190.0 lb

## 2015-09-25 DIAGNOSIS — J309 Allergic rhinitis, unspecified: Secondary | ICD-10-CM

## 2015-09-25 DIAGNOSIS — I10 Essential (primary) hypertension: Secondary | ICD-10-CM

## 2015-09-25 DIAGNOSIS — J4530 Mild persistent asthma, uncomplicated: Secondary | ICD-10-CM

## 2015-09-25 DIAGNOSIS — Z0000 Encounter for general adult medical examination without abnormal findings: Secondary | ICD-10-CM | POA: Diagnosis not present

## 2015-09-25 DIAGNOSIS — Z1211 Encounter for screening for malignant neoplasm of colon: Secondary | ICD-10-CM

## 2015-09-25 MED ORDER — MONTELUKAST SODIUM 10 MG PO TABS: 10.0000 mg | ORAL_TABLET | Freq: Every day | ORAL | Status: DC

## 2015-09-25 NOTE — Progress Notes (Signed)
BP 160/90 mmHg  Pulse 84  Temp(Src) 98.8 F (37.1 C) (Oral)  Ht 5\' 8"  (1.727 m)  Wt 190 lb (86.183 kg)  BMI 28.90 kg/m2  CC: CPE  Subjective:    Patient ID: Melvin Hall, male    DOB: 10/29/1965, 49 y.o.   MRN: [id number]  HPI: Melvin GrebeKendall W Borcherding is a 49 y.o. male presenting on 09/25/2015 for Annual Exam   Prior saw Dr Kriste BasqueNadel. He has moderate persistent asthma and allergic rhinitis controlled on singulair and hypertension controlled on amlodipine 5mg  daily. Requests refill of both of these.  HTN - bp elevated today. Has bp cuff at home but no recent checks.   BP Readings from Last 3 Encounters:  09/25/15 160/90  08/19/15 158/81  06/18/15 162/90    Preventative: Colon cancer screening - discussed. Would like iFOB.  Prostate cancer screening - normal DREs in the past. Will check next year PSA/DRE.  No fmhx colon or prostate cancer. No BM changes or blood in stool  Declines flu shot Tdap 05/2013 Seat belt use discussed Sunscreen use discussed. ? changing moles on skin.   Lives with wife. Outside dogs. 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 Interim medical history since our last visit reviewed. Allergies and medications reviewed and updated. Current Outpatient Prescriptions on File Prior to Visit  Medication Sig  . amLODipine (NORVASC) 5 MG tablet TAKE ONE TABLET EVERY DAY   No current facility-administered medications on file prior to visit.    Review of Systems  Constitutional: Negative for fever, chills, activity change, appetite change, fatigue and unexpected weight change.  HENT: Negative for hearing loss.   Eyes: Negative for visual disturbance.  Respiratory: Negative for cough, chest tightness, shortness of breath and wheezing.   Cardiovascular: Negative  for chest pain, palpitations and leg swelling.  Gastrointestinal: Negative for nausea, vomiting, abdominal pain, diarrhea, constipation, blood in stool and abdominal distention.  Genitourinary: Negative for hematuria and difficulty urinating.  Musculoskeletal: Negative for myalgias, arthralgias and neck pain.  Skin: Negative for rash.  Neurological: Negative for dizziness, seizures, syncope and headaches.  Hematological: Negative for adenopathy. Does not bruise/bleed easily.  Psychiatric/Behavioral: Negative for dysphoric mood. The patient is not nervous/anxious.    Per HPI unless specifically indicated in ROS section     Objective:    BP 160/90 mmHg  Pulse 84  Temp(Src) 98.8 F (37.1 C) (Oral)  Ht 5\' 8"  (1.727 m)  Wt 190 lb (86.183 kg)  BMI 28.90 kg/m2  Wt Readings from Last 3 Encounters:  09/25/15 190 lb (86.183 kg)  08/19/15 185 lb (83.915 kg)  06/18/15 195 lb (88.451 kg)   Physical Exam  Constitutional: He is oriented to person, place, and time. He appears well-developed and well-nourished. No distress.  HENT:  Head: Normocephalic and atraumatic.  Right Ear: Hearing, tympanic membrane, external ear and ear canal normal.  Left Ear: Hearing, tympanic membrane, external ear and ear canal normal.  Nose: Nose normal.  Mouth/Throat: Uvula is midline, oropharynx is clear and moist and mucous membranes are normal. No oropharyngeal exudate, posterior oropharyngeal edema or posterior oropharyngeal erythema.  Eyes: Conjunctivae and EOM are normal. Pupils are equal, round, and reactive to light. No scleral icterus.  Neck: Normal range of motion. Neck supple.  Cardiovascular: Normal rate, regular rhythm, normal heart sounds and  intact distal pulses.   No murmur heard. Pulses:      Radial pulses are 2+ on the right side, and 2+ on the left side.  Pulmonary/Chest: Effort normal and breath sounds normal. No respiratory distress. He has no wheezes. He has no rales.  Abdominal: Soft. Bowel  sounds are normal. He exhibits no distension and no mass. There is no tenderness. There is no rebound and no guarding.  Musculoskeletal: Normal range of motion. He exhibits no edema.  Lymphadenopathy:    He has no cervical adenopathy.  Neurological: He is alert and oriented to person, place, and time.  CN grossly intact, station and gait intact  Skin: Skin is warm and dry. No rash noted.  Psychiatric: He has a normal mood and affect. His behavior is normal. Judgment and thought content normal.  Nursing note and vitals reviewed.  Results for orders placed or performed in visit on 09/20/15  Lipid panel  Result Value Ref Range   Cholesterol 169 0 - 200 mg/dL   Triglycerides 161.0 0.0 - 149.0 mg/dL   HDL 96.04 >54.09 mg/dL   VLDL 81.1 0.0 - 91.4 mg/dL   LDL Cholesterol 782 (H) 0 - 99 mg/dL   Total CHOL/HDL Ratio 4    NonHDL 127.15   Basic metabolic panel  Result Value Ref Range   Sodium 140 135 - 145 mEq/L   Potassium 4.3 3.5 - 5.1 mEq/L   Chloride 103 96 - 112 mEq/L   CO2 31 19 - 32 mEq/L   Glucose, Bld 105 (H) 70 - 99 mg/dL   BUN 11 6 - 23 mg/dL   Creatinine, Ser 9.56 0.40 - 1.50 mg/dL   Calcium 9.6 8.4 - 21.3 mg/dL   GFR 08.65 >78.46 mL/min      Assessment & Plan:   Problem List Items Addressed This Visit    Health maintenance examination - Primary    Preventative protocols reviewed and updated unless pt declined. Discussed healthy diet and lifestyle.  Pt requests to space out next CPE to March 2018. Discussed.      Extrinsic asthma    Recent PFTs by pulm normal. Pt off respiratory medications.      Relevant Medications   montelukast (SINGULAIR) 10 MG tablet   Essential hypertension, benign    Chronic, above goal again. Pt thinks he has white coat hypertension. Will start checking bp at home and mail me log of bp in 1-2 wks to decide on bp f/u.      Allergic rhinitis    singulair refilled.       Other Visit Diagnoses    Special screening for malignant  neoplasms, colon        Relevant Orders    Fecal occult blood, imunochemical        Follow up plan: Return in about 1 year (around 09/24/2016), or as needed, for annual exam, prior fasting for blood work.

## 2015-09-25 NOTE — Assessment & Plan Note (Signed)
singulair refilled.

## 2015-09-25 NOTE — Assessment & Plan Note (Addendum)
Preventative protocols reviewed and updated unless pt declined. Discussed healthy diet and lifestyle.  Pt requests to space out next CPE to March 2018. Discussed.

## 2015-09-25 NOTE — Assessment & Plan Note (Signed)
Recent PFTs by pulm normal. Pt off respiratory medications.

## 2015-09-25 NOTE — Patient Instructions (Addendum)
Pass by lab to pick up stool kit. Good to see you today, call us with questions. Return as needed or in 1 year for next physical. For blood pressure - start checking blood pressures at home and call me in 2 weeks with an update. Goal blood pressure <140/90.   Health Maintenance, Male A healthy lifestyle and preventative care can promote health and wellness.  Maintain regular health, dental, and eye exams.  Eat a healthy diet. Foods like vegetables, fruits, whole grains, low-fat dairy products, and lean protein foods contain the nutrients you need and are low in calories. Decrease your intake of foods high in solid fats, added sugars, and salt. Get information about a proper diet from your health care provider, if necessary.  Regular physical exercise is one of the most important things you can do for your health. Most adults should get at least 150 minutes of moderate-intensity exercise (any activity that increases your heart rate and causes you to sweat) each week. In addition, most adults need muscle-strengthening exercises on 2 or more days a week.   Maintain a healthy weight. The body mass index (BMI) is a screening tool to identify possible weight problems. It provides an estimate of body fat based on height and weight. Your health care provider can find your BMI and can help you achieve or maintain a healthy weight. For males 20 years and older:  A BMI below 18.5 is considered underweight.  A BMI of 18.5 to 24.9 is normal.  A BMI of 25 to 29.9 is considered overweight.  A BMI of 30 and above is considered obese.  Maintain normal blood lipids and cholesterol by exercising and minimizing your intake of saturated fat. Eat a balanced diet with plenty of fruits and vegetables. Blood tests for lipids and cholesterol should begin at age 8 and be repeated every 5 years. If your lipid or cholesterol levels are high, you are over age 1, or you are at high risk for heart disease, you may need  your cholesterol levels checked more frequently.Ongoing high lipid and cholesterol levels should be treated with medicines if diet and exercise are not working.  If you smoke, find out from your health care provider how to quit. If you do not use tobacco, do not start.  Lung cancer screening is recommended for adults aged 57-80 years who are at high risk for developing lung cancer because of a history of smoking. A yearly low-dose CT scan of the lungs is recommended for people who have at least a 30-pack-year history of smoking and are current smokers or have quit within the past 15 years. A pack year of smoking is smoking an average of 1 pack of cigarettes a day for 1 year (for example, a 30-pack-year history of smoking could mean smoking 1 pack a day for 30 years or 2 packs a day for 15 years). Yearly screening should continue until the smoker has stopped smoking for at least 15 years. Yearly screening should be stopped for people who develop a health problem that would prevent them from having lung cancer treatment.  If you choose to drink alcohol, do not have more than 2 drinks per day. One drink is considered to be 12 oz (360 mL) of beer, 5 oz (150 mL) of wine, or 1.5 oz (45 mL) of liquor.  Avoid the use of street drugs. Do not share needles with anyone. Ask for help if you need support or instructions about stopping the use of drugs.  High blood pressure causes heart disease and increases the risk of stroke. High blood pressure is more likely to develop in:  People who have blood pressure in the end of the normal range (100-139/85-89 mm Hg).  People who are overweight or obese.  People who are African American.  If you are 109-2 years of age, have your blood pressure checked every 3-5 years. If you are 94 years of age or older, have your blood pressure checked every year. You should have your blood pressure measured twice--once when you are at a hospital or clinic, and once when you are not  at a hospital or clinic. Record the average of the two measurements. To check your blood pressure when you are not at a hospital or clinic, you can use:  An automated blood pressure machine at a pharmacy.  A home blood pressure monitor.  If you are 40-82 years old, ask your health care provider if you should take aspirin to prevent heart disease.  Diabetes screening involves taking a blood sample to check your fasting blood sugar level. This should be done once every 3 years after age 32 if you are at a normal weight and without risk factors for diabetes. Testing should be considered at a younger age or be carried out more frequently if you are overweight and have at least 1 risk factor for diabetes.  Colorectal cancer can be detected and often prevented. Most routine colorectal cancer screening begins at the age of 67 and continues through age 78. However, your health care provider may recommend screening at an earlier age if you have risk factors for colon cancer. On a yearly basis, your health care provider may provide home test kits to check for hidden blood in the stool. A small camera at the end of a tube may be used to directly examine the colon (sigmoidoscopy or colonoscopy) to detect the earliest forms of colorectal cancer. Talk to your health care provider about this at age 24 when routine screening begins. A direct exam of the colon should be repeated every 5-10 years through age 30, unless early forms of precancerous polyps or small growths are found.  People who are at an increased risk for hepatitis B should be screened for this virus. You are considered at high risk for hepatitis B if:  You were born in a country where hepatitis B occurs often. Talk with your health care provider about which countries are considered high risk.  Your parents were born in a high-risk country and you have not received a shot to protect against hepatitis B (hepatitis B vaccine).  You have HIV or  AIDS.  You use needles to inject street drugs.  You live with, or have sex with, someone who has hepatitis B.  You are a man who has sex with other men (MSM).  You get hemodialysis treatment.  You take certain medicines for conditions like cancer, organ transplantation, and autoimmune conditions.  Hepatitis C blood testing is recommended for all people born from [birth year] through 1965 and any individual with known risk factors for hepatitis C.  Healthy men should no longer receive prostate-specific antigen (PSA) blood tests as part of routine cancer screening. Talk to your health care provider about prostate cancer screening.  Testicular cancer screening is not recommended for adolescents or adult males who have no symptoms. Screening includes self-exam, a health care provider exam, and other screening tests. Consult with your health care provider about any symptoms you have or any  concerns you have about testicular cancer.  Practice safe sex. Use condoms and avoid high-risk sexual practices to reduce the spread of sexually transmitted infections (STIs).  You should be screened for STIs, including gonorrhea and chlamydia if:  You are sexually active and are younger than 24 years.  You are older than 24 years, and your health care provider tells you that you are at risk for this type of infection.  Your sexual activity has changed since you were last screened, and you are at an increased risk for chlamydia or gonorrhea. Ask your health care provider if you are at risk.  If you are at risk of being infected with HIV, it is recommended that you take a prescription medicine daily to prevent HIV infection. This is called pre-exposure prophylaxis (PrEP). You are considered at risk if:  You are a man who has sex with other men (MSM).  You are a heterosexual man who is sexually active with multiple partners.  You take drugs by injection.  You are sexually active with a partner who has  HIV.  Talk with your health care provider about whether you are at high risk of being infected with HIV. If you choose to begin PrEP, you should first be tested for HIV. You should then be tested every 3 months for as long as you are taking PrEP.  Use sunscreen. Apply sunscreen liberally and repeatedly throughout the day. You should seek shade when your shadow is shorter than you. Protect yourself by wearing long sleeves, pants, a wide-brimmed hat, and sunglasses year round whenever you are outdoors.  Tell your health care provider of new moles or changes in moles, especially if there is a change in shape or color. Also, tell your health care provider if a mole is larger than the size of a pencil eraser.  A one-time screening for abdominal aortic aneurysm (AAA) and surgical repair of large AAAs by ultrasound is recommended for men aged 15-75 years who are current or former smokers.  Stay current with your vaccines (immunizations).   This information is not intended to replace advice given to you by your health care provider. Make sure you discuss any questions you have with your health care provider.   Document Released: 04/03/2008 Document Revised: 10/27/2014 Document Reviewed: 03/03/2011 Elsevier Interactive Patient Education [employer].

## 2015-09-25 NOTE — Assessment & Plan Note (Addendum)
Chronic, above goal again. Pt thinks he has white coat hypertension. Will start checking bp at home and mail me log of bp in 1-2 wks to decide on bp f/u.

## 2015-09-25 NOTE — Progress Notes (Signed)
Pre visit review using our clinic review tool, if applicable. No additional management support is needed unless otherwise documented below in the visit note. 

## 2015-10-17 ENCOUNTER — Encounter: Payer: Self-pay | Admitting: *Deleted

## 2015-10-17 ENCOUNTER — Other Ambulatory Visit: Payer: BC Managed Care – PPO

## 2015-10-17 DIAGNOSIS — Z1211 Encounter for screening for malignant neoplasm of colon: Secondary | ICD-10-CM

## 2015-10-17 LAB — FECAL OCCULT BLOOD, IMMUNOCHEMICAL: Fecal Occult Bld: NEGATIVE

## 2015-10-17 LAB — FECAL OCCULT BLOOD, GUAIAC: FECAL OCCULT BLD: NEGATIVE

## 2015-10-18 ENCOUNTER — Encounter: Payer: Self-pay | Admitting: Family Medicine

## 2015-11-05 ENCOUNTER — Encounter: Payer: Self-pay | Admitting: Family Medicine

## 2015-11-05 DIAGNOSIS — IMO0001 Reserved for inherently not codable concepts without codable children: Secondary | ICD-10-CM

## 2016-06-19 ENCOUNTER — Ambulatory Visit: Payer: BC Managed Care – PPO | Admitting: Internal Medicine

## 2016-07-18 ENCOUNTER — Ambulatory Visit: Payer: BC Managed Care – PPO | Admitting: Internal Medicine

## 2016-08-20 ENCOUNTER — Other Ambulatory Visit: Payer: Self-pay | Admitting: Family Medicine

## 2016-10-06 ENCOUNTER — Other Ambulatory Visit: Payer: Self-pay | Admitting: Family Medicine

## 2016-10-06 DIAGNOSIS — I10 Essential (primary) hypertension: Secondary | ICD-10-CM

## 2016-10-06 DIAGNOSIS — Z125 Encounter for screening for malignant neoplasm of prostate: Secondary | ICD-10-CM

## 2016-10-07 ENCOUNTER — Other Ambulatory Visit (INDEPENDENT_AMBULATORY_CARE_PROVIDER_SITE_OTHER): Payer: BC Managed Care – PPO

## 2016-10-07 DIAGNOSIS — Z125 Encounter for screening for malignant neoplasm of prostate: Secondary | ICD-10-CM | POA: Diagnosis not present

## 2016-10-07 DIAGNOSIS — I10 Essential (primary) hypertension: Secondary | ICD-10-CM

## 2016-10-07 LAB — BASIC METABOLIC PANEL
BUN: 15 mg/dL (ref 6–23)
CALCIUM: 10.1 mg/dL (ref 8.4–10.5)
CHLORIDE: 101 meq/L (ref 96–112)
CO2: 32 meq/L (ref 19–32)
CREATININE: 1.03 mg/dL (ref 0.40–1.50)
GFR: 80.83 mL/min (ref >60.00)
GLUCOSE: 103 mg/dL — AB (ref 70–99)
Potassium: 5.3 mEq/L — ABNORMAL HIGH (ref 3.5–5.1)
Sodium: 140 mEq/L (ref 135–145)

## 2016-10-07 LAB — PSA: PSA: 0.41 ng/mL (ref 0.10–4.00)

## 2016-10-07 LAB — LIPID PANEL
CHOLESTEROL: 169 mg/dL (ref 0–200)
HDL: 42.9 mg/dL (ref >39.00)
LDL CALC: 105 mg/dL — AB (ref 0–99)
NonHDL: 126.25
TRIGLYCERIDES: 104 mg/dL (ref 0.0–149.0)
Total CHOL/HDL Ratio: 4
VLDL: 20.8 mg/dL (ref 0.0–40.0)

## 2016-10-10 ENCOUNTER — Encounter: Payer: Self-pay | Admitting: Family Medicine

## 2016-10-10 ENCOUNTER — Ambulatory Visit (INDEPENDENT_AMBULATORY_CARE_PROVIDER_SITE_OTHER): Payer: BC Managed Care – PPO | Admitting: Family Medicine

## 2016-10-10 VITALS — BP 164/80 | HR 86 | Ht 67.5 in | Wt 196.0 lb

## 2016-10-10 DIAGNOSIS — Z0000 Encounter for general adult medical examination without abnormal findings: Secondary | ICD-10-CM

## 2016-10-10 DIAGNOSIS — E669 Obesity, unspecified: Secondary | ICD-10-CM | POA: Diagnosis not present

## 2016-10-10 DIAGNOSIS — I10 Essential (primary) hypertension: Secondary | ICD-10-CM | POA: Diagnosis not present

## 2016-10-10 DIAGNOSIS — E663 Overweight: Secondary | ICD-10-CM | POA: Insufficient documentation

## 2016-10-10 DIAGNOSIS — Z1211 Encounter for screening for malignant neoplasm of colon: Secondary | ICD-10-CM

## 2016-10-10 NOTE — Assessment & Plan Note (Signed)
Reviewed trending weight gain noted. Discussed healthy diet and lifestyle changes to affect sustainable weight loss.

## 2016-10-10 NOTE — Assessment & Plan Note (Addendum)
Chronic, stable on amlodipine 5mg  daily. Known white coat hypertension. Elevated bp on repeat (170/90). Pt will monitor at home and let me know if staying elevated.

## 2016-10-10 NOTE — Assessment & Plan Note (Signed)
Preventative protocols reviewed and updated unless pt declined. Discussed healthy diet and lifestyle.  

## 2016-10-10 NOTE — Patient Instructions (Addendum)
Pass by lab to pick up stool kit Let me know if blood pressure at home staying >140/90.  Good to see you today, return as needed or in 1 year for next physical.  Health Maintenance, Male A healthy lifestyle and preventative care can promote health and wellness.  Maintain regular health, dental, and eye exams.  Eat a healthy diet. Foods like vegetables, fruits, whole grains, low-fat dairy products, and lean protein foods contain the nutrients you need and are low in calories. Decrease your intake of foods high in solid fats, added sugars, and salt. Get information about a proper diet from your health care provider, if necessary.  Regular physical exercise is one of the most important things you can do for your health. Most adults should get at least 150 minutes of moderate-intensity exercise (any activity that increases your heart rate and causes you to sweat) each week. In addition, most adults need muscle-strengthening exercises on 2 or more days a week.   Maintain a healthy weight. The body mass index (BMI) is a screening tool to identify possible weight problems. It provides an estimate of body fat based on height and weight. Your health care provider can find your BMI and can help you achieve or maintain a healthy weight. For males 20 years and older:  A BMI below 18.5 is considered underweight.  A BMI of 18.5 to 24.9 is normal.  A BMI of 25 to 29.9 is considered overweight.  A BMI of 30 and above is considered obese.  Maintain normal blood lipids and cholesterol by exercising and minimizing your intake of saturated fat. Eat a balanced diet with plenty of fruits and vegetables. Blood tests for lipids and cholesterol should begin at age 4 and be repeated every 5 years. If your lipid or cholesterol levels are high, you are over age 36, or you are at high risk for heart disease, you may need your cholesterol levels checked more frequently.Ongoing high lipid and cholesterol levels should be  treated with medicines if diet and exercise are not working.  If you smoke, find out from your health care provider how to quit. If you do not use tobacco, do not start.  Lung cancer screening is recommended for adults aged 105-80 years who are at high risk for developing lung cancer because of a history of smoking. A yearly low-dose CT scan of the lungs is recommended for people who have at least a 30-pack-year history of smoking and are current smokers or have quit within the past 15 years. A pack year of smoking is smoking an average of 1 pack of cigarettes a day for 1 year (for example, a 30-pack-year history of smoking could mean smoking 1 pack a day for 30 years or 2 packs a day for 15 years). Yearly screening should continue until the smoker has stopped smoking for at least 15 years. Yearly screening should be stopped for people who develop a health problem that would prevent them from having lung cancer treatment.  If you choose to drink alcohol, do not have more than 2 drinks per day. One drink is considered to be 12 oz (360 mL) of beer, 5 oz (150 mL) of wine, or 1.5 oz (45 mL) of liquor.  Avoid the use of street drugs. Do not share needles with anyone. Ask for help if you need support or instructions about stopping the use of drugs.  High blood pressure causes heart disease and increases the risk of stroke. High blood pressure is  more likely to develop in:  People who have blood pressure in the end of the normal range (100-139/85-89 mm Hg).  People who are overweight or obese.  People who are African American.  If you are 6-58 years of age, have your blood pressure checked every 3-5 years. If you are 59 years of age or older, have your blood pressure checked every year. You should have your blood pressure measured twice-once when you are at a hospital or clinic, and once when you are not at a hospital or clinic. Record the average of the two measurements. To check your blood pressure when  you are not at a hospital or clinic, you can use:  An automated blood pressure machine at a pharmacy.  A home blood pressure monitor.  If you are 12-1 years old, ask your health care provider if you should take aspirin to prevent heart disease.  Diabetes screening involves taking a blood sample to check your fasting blood sugar level. This should be done once every 3 years after age 41 if you are at a normal weight and without risk factors for diabetes. Testing should be considered at a younger age or be carried out more frequently if you are overweight and have at least 1 risk factor for diabetes.  Colorectal cancer can be detected and often prevented. Most routine colorectal cancer screening begins at the age of 85 and continues through age 41. However, your health care provider may recommend screening at an earlier age if you have risk factors for colon cancer. On a yearly basis, your health care provider may provide home test kits to check for hidden blood in the stool. A small camera at the end of a tube may be used to directly examine the colon (sigmoidoscopy or colonoscopy) to detect the earliest forms of colorectal cancer. Talk to your health care provider about this at age 93 when routine screening begins. A direct exam of the colon should be repeated every 5-10 years through age 56, unless early forms of precancerous polyps or small growths are found.  People who are at an increased risk for hepatitis B should be screened for this virus. You are considered at high risk for hepatitis B if:  You were born in a country where hepatitis B occurs often. Talk with your health care provider about which countries are considered high risk.  Your parents were born in a high-risk country and you have not received a shot to protect against hepatitis B (hepatitis B vaccine).  You have HIV or AIDS.  You use needles to inject street drugs.  You live with, or have sex with, someone who has hepatitis  B.  You are a man who has sex with other men (MSM).  You get hemodialysis treatment.  You take certain medicines for conditions like cancer, organ transplantation, and autoimmune conditions.  Hepatitis C blood testing is recommended for all people born from [birth year] through 1966 and any individual with known risk factors for hepatitis C.  Healthy men should no longer receive prostate-specific antigen (PSA) blood tests as part of routine cancer screening. Talk to your health care provider about prostate cancer screening.  Testicular cancer screening is not recommended for adolescents or adult males who have no symptoms. Screening includes self-exam, a health care provider exam, and other screening tests. Consult with your health care provider about any symptoms you have or any concerns you have about testicular cancer.  Practice safe sex. Use condoms and avoid high-risk sexual  practices to reduce the spread of sexually transmitted infections (STIs).  You should be screened for STIs, including gonorrhea and chlamydia if:  You are sexually active and are younger than 24 years.  You are older than 24 years, and your health care provider tells you that you are at risk for this type of infection.  Your sexual activity has changed since you were last screened, and you are at an increased risk for chlamydia or gonorrhea. Ask your health care provider if you are at risk.  If you are at risk of being infected with HIV, it is recommended that you take a prescription medicine daily to prevent HIV infection. This is called pre-exposure prophylaxis (PrEP). You are considered at risk if:  You are a man who has sex with other men (MSM).  You are a heterosexual man who is sexually active with multiple partners.  You take drugs by injection.  You are sexually active with a partner who has HIV.  Talk with your health care provider about whether you are at high risk of being infected with HIV. If you  choose to begin PrEP, you should first be tested for HIV. You should then be tested every 3 months for as long as you are taking PrEP.  Use sunscreen. Apply sunscreen liberally and repeatedly throughout the day. You should seek shade when your shadow is shorter than you. Protect yourself by wearing long sleeves, pants, a wide-brimmed hat, and sunglasses year round whenever you are outdoors.  Tell your health care provider of new moles or changes in moles, especially if there is a change in shape or color. Also, tell your health care provider if a mole is larger than the size of a pencil eraser.  A one-time screening for abdominal aortic aneurysm (AAA) and surgical repair of large AAAs by ultrasound is recommended for men aged 66-75 years who are current or former smokers.  Stay current with your vaccines (immunizations). This information is not intended to replace advice given to you by your health care provider. Make sure you discuss any questions you have with your health care provider. Document Released: 04/03/2008 Document Revised: 10/27/2014 Document Reviewed: 07/10/2015 Elsevier Interactive Patient Education  2017 [employer].

## 2016-10-10 NOTE — Progress Notes (Signed)
BP (!) 164/80   Pulse 86   Ht 5' 7.5" (1.715 m)   Wt 196 lb (88.9 kg)   SpO2 98%   BMI 30.24 kg/m    CC: CPE Subjective:    Patient ID: Melvin Hall, male    DOB: 08/21/1966, 50 y.o.   MRN: [id number]  HPI: Melvin Hall is a 50 y.o. male presenting on 10/10/2016 for Annual Exam   White coat hypertension.   Preventative: Colon cancer screening - discussed. Would like iFOB  Prostate cancer screening - PSA this year. Declines DRE. To consider next year.  Declines flu shot Tdap 05/2013 Seat belt use discussed Sunscreen use discussed. No changing moles on skin.  Non smoker Alcohol - none  Lives with wife. Outside dogs. 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 Interim medical history since our last visit reviewed. Allergies and medications reviewed and updated. Current Outpatient Prescriptions on File Prior to Visit  Medication Sig  . amLODipine (NORVASC) 5 MG tablet TAKE ONE TABLET BY MOUTH EVERY DAY   No current facility-administered medications on file prior to visit.     Review of Systems  Constitutional: Negative for activity change, appetite change, chills, fatigue, fever and unexpected weight change.  HENT: Negative for hearing loss.   Eyes: Negative for visual disturbance.  Respiratory: Negative for cough, chest tightness, shortness of breath and wheezing.   Cardiovascular: Negative for chest pain, palpitations and leg swelling.  Gastrointestinal: Negative for abdominal distention, abdominal pain, blood in stool, constipation, diarrhea, nausea and vomiting.  Genitourinary: Negative for difficulty urinating and hematuria.  Musculoskeletal: Negative for arthralgias, myalgias and neck pain.  Skin: Negative for rash.  Neurological: Negative for  dizziness, seizures, syncope and headaches.  Hematological: Negative for adenopathy. Does not bruise/bleed easily.  Psychiatric/Behavioral: Negative for dysphoric mood. The patient is not nervous/anxious.    Per HPI unless specifically indicated in ROS section     Objective:    BP (!) 164/80   Pulse 86   Ht 5' 7.5" (1.715 m)   Wt 196 lb (88.9 kg)   SpO2 98%   BMI 30.24 kg/m   Wt Readings from Last 3 Encounters:  10/10/16 196 lb (88.9 kg)  09/25/15 190 lb (86.2 kg)  08/19/15 185 lb (83.9 kg)    Physical Exam  Constitutional: He is oriented to person, place, and time. He appears well-developed and well-nourished. No distress.  HENT:  Head: Normocephalic and atraumatic.  Right Ear: Hearing, tympanic membrane, external ear and ear canal normal.  Left Ear: Hearing, tympanic membrane, external ear and ear canal normal.  Nose: Nose normal.  Mouth/Throat: Uvula is midline, oropharynx is clear and moist and mucous membranes are normal. No oropharyngeal exudate, posterior oropharyngeal edema or posterior oropharyngeal erythema.  Eyes: Conjunctivae and EOM are normal. Pupils are equal, round, and reactive to light. No scleral icterus.  Neck: Normal range of motion. Neck supple.  Cardiovascular: Normal rate, regular rhythm, normal heart sounds and intact distal pulses.   No murmur heard. Pulses:      Radial pulses are 2+ on the right side, and 2+ on the left side.  Pulmonary/Chest: Effort normal and breath sounds normal. No respiratory distress. He has no wheezes. He has no rales.  Abdominal: Soft. Bowel sounds are normal. He exhibits no distension and  no mass. There is no tenderness. There is no rebound and no guarding.  Musculoskeletal: Normal range of motion. He exhibits no edema.  Lymphadenopathy:    He has no cervical adenopathy.  Neurological: He is alert and oriented to person, place, and time.  CN grossly intact, station and gait intact  Skin: Skin is warm and dry. No rash  noted.  Psychiatric: He has a normal mood and affect. His behavior is normal. Judgment and thought content normal.  Nursing note and vitals reviewed.  Results for orders placed or performed in visit on 10/07/16  Lipid panel  Result Value Ref Range   Cholesterol 169 0 - 200 mg/dL   Triglycerides 161.0104.0 0.0 - 149.0 mg/dL   HDL 96.0442.90 >54.09>39.00 mg/dL   VLDL 81.120.8 0.0 - 91.440.0 mg/dL   LDL Cholesterol 782105 (H) 0 - 99 mg/dL   Total CHOL/HDL Ratio 4    NonHDL 126.25   PSA  Result Value Ref Range   PSA 0.41 0.10 - 4.00 ng/mL  Basic metabolic panel  Result Value Ref Range   Sodium 140 135 - 145 mEq/L   Potassium 5.3 (H) 3.5 - 5.1 mEq/L   Chloride 101 96 - 112 mEq/L   CO2 32 19 - 32 mEq/L   Glucose, Bld 103 (H) 70 - 99 mg/dL   BUN 15 6 - 23 mg/dL   Creatinine, Ser 9.561.03 0.40 - 1.50 mg/dL   Calcium 21.310.1 8.4 - 08.610.5 mg/dL   GFR 57.8480.83 >69.62>60.00 mL/min      Assessment & Plan:   Problem List Items Addressed This Visit    Health maintenance examination - Primary    Preventative protocols reviewed and updated unless pt declined. Discussed healthy diet and lifestyle.       Obesity, Class I, BMI 30-34.9    Reviewed trending weight gain noted. Discussed healthy diet and lifestyle changes to affect sustainable weight loss.      White coat syndrome with hypertension    Chronic, stable on amlodipine 5mg  daily. Known white coat hypertension. Elevated bp on repeat (170/90). Pt will monitor at home and let me know if staying elevated.        Other Visit Diagnoses    Special screening for malignant neoplasms, colon       Relevant Orders   Fecal occult blood, imunochemical       Follow up plan: Return in about 1 year (around 10/10/2017) for annual exam, prior fasting for blood work.  Eustaquio BoydenJavier Shanell Aden, MD

## 2016-10-17 ENCOUNTER — Other Ambulatory Visit (INDEPENDENT_AMBULATORY_CARE_PROVIDER_SITE_OTHER): Payer: BC Managed Care – PPO

## 2016-10-17 DIAGNOSIS — Z1211 Encounter for screening for malignant neoplasm of colon: Secondary | ICD-10-CM

## 2016-10-17 LAB — FECAL OCCULT BLOOD, GUAIAC: Fecal Occult Blood: NEGATIVE

## 2016-10-17 LAB — FECAL OCCULT BLOOD, IMMUNOCHEMICAL: Fecal Occult Bld: NEGATIVE

## 2016-10-21 ENCOUNTER — Encounter: Payer: Self-pay | Admitting: *Deleted

## 2016-12-01 ENCOUNTER — Other Ambulatory Visit: Payer: Self-pay | Admitting: Family Medicine

## 2017-10-11 ENCOUNTER — Other Ambulatory Visit: Payer: Self-pay | Admitting: Family Medicine

## 2017-10-11 DIAGNOSIS — Z125 Encounter for screening for malignant neoplasm of prostate: Secondary | ICD-10-CM

## 2017-10-11 DIAGNOSIS — I10 Essential (primary) hypertension: Secondary | ICD-10-CM

## 2017-10-12 ENCOUNTER — Other Ambulatory Visit (INDEPENDENT_AMBULATORY_CARE_PROVIDER_SITE_OTHER): Payer: BC Managed Care – PPO

## 2017-10-12 DIAGNOSIS — Z125 Encounter for screening for malignant neoplasm of prostate: Secondary | ICD-10-CM

## 2017-10-12 DIAGNOSIS — I10 Essential (primary) hypertension: Secondary | ICD-10-CM

## 2017-10-12 LAB — LIPID PANEL
CHOL/HDL RATIO: 4
Cholesterol: 155 mg/dL (ref 0–200)
HDL: 41.6 mg/dL (ref >39.00)
LDL Cholesterol: 87 mg/dL (ref 0–99)
NonHDL: 113.88
Triglycerides: 132 mg/dL (ref 0.0–149.0)
VLDL: 26.4 mg/dL (ref 0.0–40.0)

## 2017-10-12 LAB — BASIC METABOLIC PANEL
BUN: 15 mg/dL (ref 6–23)
CALCIUM: 9.4 mg/dL (ref 8.4–10.5)
CO2: 30 meq/L (ref 19–32)
CREATININE: 1.02 mg/dL (ref 0.40–1.50)
Chloride: 103 mEq/L (ref 96–112)
GFR: 81.42 mL/min (ref >60.00)
Glucose, Bld: 98 mg/dL (ref 70–99)
Potassium: 4.8 mEq/L (ref 3.5–5.1)
Sodium: 139 mEq/L (ref 135–145)

## 2017-10-12 LAB — PSA: PSA: 0.6 ng/mL (ref 0.10–4.00)

## 2017-10-16 ENCOUNTER — Ambulatory Visit (INDEPENDENT_AMBULATORY_CARE_PROVIDER_SITE_OTHER): Payer: BC Managed Care – PPO | Admitting: Family Medicine

## 2017-10-16 ENCOUNTER — Encounter: Payer: Self-pay | Admitting: Family Medicine

## 2017-10-16 VITALS — BP 140/66 | HR 87 | Temp 98.4°F | Ht 68.75 in | Wt 193.0 lb

## 2017-10-16 DIAGNOSIS — Z1211 Encounter for screening for malignant neoplasm of colon: Secondary | ICD-10-CM

## 2017-10-16 DIAGNOSIS — I10 Essential (primary) hypertension: Secondary | ICD-10-CM | POA: Diagnosis not present

## 2017-10-16 DIAGNOSIS — E663 Overweight: Secondary | ICD-10-CM | POA: Diagnosis not present

## 2017-10-16 DIAGNOSIS — Z0000 Encounter for general adult medical examination without abnormal findings: Secondary | ICD-10-CM | POA: Diagnosis not present

## 2017-10-16 MED ORDER — AMLODIPINE BESYLATE 5 MG PO TABS: 5.0000 mg | ORAL_TABLET | Freq: Every day | ORAL | 3 refills | Status: DC

## 2017-10-16 NOTE — Assessment & Plan Note (Signed)
Preventative protocols reviewed and updated unless pt declined. Discussed healthy diet and lifestyle.  

## 2017-10-16 NOTE — Assessment & Plan Note (Signed)
Encouraged healthy diet and lifestyle changes to affect sustainable weight loss.  

## 2017-10-16 NOTE — Patient Instructions (Addendum)
You are doing well today.  Continue working on [employer] diet.  Return as needed or in 1 year for next physical.  Health Maintenance, Male A healthy lifestyle and preventive care is important for your health and wellness. Ask your health care provider about what schedule of regular examinations is right for you. What should I know about weight and diet? Eat a Healthy Diet  Eat plenty of vegetables, fruits, whole grains, low-fat dairy products, and lean protein.  Do not eat a lot of foods high in solid fats, added sugars, or salt.  Maintain a Healthy Weight Regular exercise can help you achieve or maintain a healthy weight. You should:  Do at least 150 minutes of exercise each week. The exercise should increase your heart rate and make you sweat (moderate-intensity exercise).  Do strength-training exercises at least twice a week.  Watch Your Levels of Cholesterol and Blood Lipids  Have your blood tested for lipids and cholesterol every 5 years starting at 52 years of age. If you are at high risk for heart disease, you should start having your blood tested when you are 52 years old. You may need to have your cholesterol levels checked more often if: ? Your lipid or cholesterol levels are high. ? You are older than 10250 years of age. ? You are at high risk for heart disease.  What should I know about cancer screening? Many types of cancers can be detected early and may often be prevented. Lung Cancer  You should be screened every year for lung cancer if: ? You are a current smoker who has smoked for at least 30 years. ? You are a former smoker who has quit within the past 15 years.  Talk to your health care provider about your screening options, when you should start screening, and how often you should be screened.  Colorectal Cancer  Routine colorectal cancer screening usually begins at 52 years of age and should be repeated every 5-10 years until you are 52 years old. You may need  to be screened more often if early forms of precancerous polyps or small growths are found. Your health care provider may recommend screening at an earlier age if you have risk factors for colon cancer.  Your health care provider may recommend using home test kits to check for hidden blood in the stool.  A small camera at the end of a tube can be used to examine your colon (sigmoidoscopy or colonoscopy). This checks for the earliest forms of colorectal cancer.  Prostate and Testicular Cancer  Depending on your age and overall health, your health care provider may do certain tests to screen for prostate and testicular cancer.  Talk to your health care provider about any symptoms or concerns you have about testicular or prostate cancer.  Skin Cancer  Check your skin from head to toe regularly.  Tell your health care provider about any new moles or changes in moles, especially if: ? There is a change in a mole's size, shape, or color. ? You have a mole that is larger than a pencil eraser.  Always use sunscreen. Apply sunscreen liberally and repeat throughout the day.  Protect yourself by wearing long sleeves, pants, a wide-brimmed hat, and sunglasses when outside.  What should I know about heart disease, diabetes, and high blood pressure?  If you are 7418-52 years of age, have your blood pressure checked every 3-5 years. If you are 52 years of age or older, have your blood  pressure checked every year. You should have your blood pressure measured twice-once when you are at a hospital or clinic, and once when you are not at a hospital or clinic. Record the average of the two measurements. To check your blood pressure when you are not at a hospital or clinic, you can use: ? An automated blood pressure machine at a pharmacy. ? A home blood pressure monitor.  Talk to your health care provider about your target blood pressure.  If you are between 40-67 years old, ask your health care provider if  you should take aspirin to prevent heart disease.  Have regular diabetes screenings by checking your fasting blood sugar level. ? If you are at a normal weight and have a low risk for diabetes, have this test once every three years after the age of 106. ? If you are overweight and have a high risk for diabetes, consider being tested at a younger age or more often.  A one-time screening for abdominal aortic aneurysm (AAA) by ultrasound is recommended for men aged 8-75 years who are current or former smokers. What should I know about preventing infection? Hepatitis B If you have a higher risk for hepatitis B, you should be screened for this virus. Talk with your health care provider to find out if you are at risk for hepatitis B infection. Hepatitis C Blood testing is recommended for:  Everyone born from [birth year] through 1966.  Anyone with known risk factors for hepatitis C.  Sexually Transmitted Diseases (STDs)  You should be screened each year for STDs including gonorrhea and chlamydia if: ? You are sexually active and are younger than 52 years of age. ? You are older than 52 years of age and your health care provider tells you that you are at risk for this type of infection. ? Your sexual activity has changed since you were last screened and you are at an increased risk for chlamydia or gonorrhea. Ask your health care provider if you are at risk.  Talk with your health care provider about whether you are at high risk of being infected with HIV. Your health care provider may recommend a prescription medicine to help prevent HIV infection.  What else can I do?  Schedule regular health, dental, and eye exams.  Stay current with your vaccines (immunizations).  Do not use any tobacco products, such as cigarettes, chewing tobacco, and e-cigarettes. If you need help quitting, ask your health care provider.  Limit alcohol intake to no more than 2 drinks per day. One drink equals 12 ounces of  beer, 5 ounces of wine, or 1 ounces of hard liquor.  Do not use street drugs.  Do not share needles.  Ask your health care provider for help if you need support or information about quitting drugs.  Tell your health care provider if you often feel depressed.  Tell your health care provider if you have ever been abused or do not feel safe at home. This information is not intended to replace advice given to you by your health care provider. Make sure you discuss any questions you have with your health care provider. Document Released: 04/03/2008 Document Revised: 06/04/2016 Document Reviewed: 07/10/2015 Elsevier Interactive Patient Education  [employer].

## 2017-10-16 NOTE — Progress Notes (Signed)
BP 140/66 (BP Location: Left Arm, Patient Position: Sitting, Cuff Size: Normal)   Pulse 87   Temp 98.4 F (36.9 C) (Oral)   Ht 5' 8.75" (1.746 m)   Wt 193 lb (87.5 kg)   SpO2 99%   BMI 28.71 kg/m    CC: CPE Subjective:    Patient ID: Melvin Hall, male    DOB: 08/08/1966, 51 y.o.   MRN: [id number]  HPI: Melvin GrebeKendall W Haapala is a 51 y.o. male presenting on 10/16/2017 for Annual Exam   White coat hypertension - today bp better. At home well controlled.   Preventative: Colon cancer screening - discussed. Would like iFOB.  Prostate cancer screening - PSA yearly. We discussed DRE - will defer for now given low risk.  Declines flu shot Tdap 05/2013 Seat belt use discussed Sunscreen use discussed. No changing moles on skin.  Non smoker Alcohol - none  Lives with wife. Outside dogs. 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 Interim medical history since our last visit reviewed. Allergies and medications reviewed and updated. Outpatient Medications Prior to Visit  Medication Sig Dispense Refill  . amLODipine (NORVASC) 5 MG tablet TAKE ONE TABLET BY MOUTH EVERY DAY 90 tablet 3   No facility-administered medications prior to visit.      Per HPI unless specifically indicated in ROS section below Review of Systems  Constitutional: Negative for activity change, appetite change, chills, fatigue, fever and unexpected weight change.  HENT: Negative for hearing loss.   Eyes: Negative for visual disturbance.  Respiratory: Negative for cough, chest tightness, shortness of breath and wheezing.   Cardiovascular: Negative for chest pain, palpitations and leg swelling.  Gastrointestinal: Negative for abdominal distention, abdominal pain, blood in stool, constipation,  diarrhea, nausea and vomiting.  Genitourinary: Negative for difficulty urinating and hematuria.  Musculoskeletal: Negative for arthralgias, myalgias and neck pain.  Skin: Negative for rash.  Neurological: Negative for dizziness, seizures, syncope and headaches.  Hematological: Negative for adenopathy. Does not bruise/bleed easily.  Psychiatric/Behavioral: Negative for dysphoric mood. The patient is not nervous/anxious.        Objective:    BP 140/66 (BP Location: Left Arm, Patient Position: Sitting, Cuff Size: Normal)   Pulse 87   Temp 98.4 F (36.9 C) (Oral)   Ht 5' 8.75" (1.746 m)   Wt 193 lb (87.5 kg)   SpO2 99%   BMI 28.71 kg/m   Wt Readings from Last 3 Encounters:  10/16/17 193 lb (87.5 kg)  10/10/16 196 lb (88.9 kg)  09/25/15 190 lb (86.2 kg)    Physical Exam  Constitutional: He is oriented to person, place, and time. He appears well-developed and well-nourished. No distress.  HENT:  Head: Normocephalic and atraumatic.  Right Ear: Hearing, tympanic membrane, external ear and ear canal normal.  Left Ear: Hearing, tympanic membrane, external ear and ear canal normal.  Nose: Nose normal.  Mouth/Throat: Uvula is midline, oropharynx is clear and moist and mucous membranes are normal. No oropharyngeal exudate, posterior oropharyngeal edema or posterior oropharyngeal erythema.  Eyes: Conjunctivae and EOM are normal. Pupils are equal, round, and reactive to light. No scleral icterus.  Neck: Normal range of motion. Neck supple. No thyromegaly present.  Cardiovascular: Normal rate, regular rhythm, normal heart sounds and intact distal pulses.  No murmur  heard. Pulses:      Radial pulses are 2+ on the right side, and 2+ on the left side.  Pulmonary/Chest: Effort normal and breath sounds normal. No respiratory distress. He has no wheezes. He has no rales.  Abdominal: Soft. Bowel sounds are normal. He exhibits no distension and no mass. There is no tenderness. There is no rebound  and no guarding.  Musculoskeletal: Normal range of motion. He exhibits no edema.  Lymphadenopathy:    He has no cervical adenopathy.  Neurological: He is alert and oriented to person, place, and time.  CN grossly intact, station and gait intact  Skin: Skin is warm and dry. No rash noted.  Psychiatric: He has a normal mood and affect. His behavior is normal. Judgment and thought content normal.  Nursing note and vitals reviewed.  Results for orders placed or performed in visit on 10/12/17  PSA  Result Value Ref Range   PSA 0.60 0.10 - 4.00 ng/mL  Basic metabolic panel  Result Value Ref Range   Sodium 139 135 - 145 mEq/L   Potassium 4.8 3.5 - 5.1 mEq/L   Chloride 103 96 - 112 mEq/L   CO2 30 19 - 32 mEq/L   Glucose, Bld 98 70 - 99 mg/dL   BUN 15 6 - 23 mg/dL   Creatinine, Ser 1.611.02 0.40 - 1.50 mg/dL   Calcium 9.4 8.4 - 09.610.5 mg/dL   GFR 04.5481.42 >09.81>60.00 mL/min  Lipid panel  Result Value Ref Range   Cholesterol 155 0 - 200 mg/dL   Triglycerides 191.4132.0 0.0 - 149.0 mg/dL   HDL 78.2941.60 >56.21>39.00 mg/dL   VLDL 30.826.4 0.0 - 65.740.0 mg/dL   LDL Cholesterol 87 0 - 99 mg/dL   Total CHOL/HDL Ratio 4    NonHDL 113.88       Assessment & Plan:   Problem List Items Addressed This Visit    Health maintenance examination - Primary    Preventative protocols reviewed and updated unless pt declined. Discussed healthy diet and lifestyle.       Overweight with body mass index (BMI) 25.0-29.9    Encouraged healthy diet and lifestyle changes to affect sustainable weight loss.       White coat syndrome with hypertension    Chronic, stable. Continue current regimen.       Relevant Medications   amLODipine (NORVASC) 5 MG tablet    Other Visit Diagnoses    Special screening for malignant neoplasms, colon       Relevant Orders   Fecal occult blood, imunochemical       Follow up plan: Return in about 1 year (around 10/16/2018) for annual exam, prior fasting for blood work.  Eustaquio BoydenJavier Kelli Egolf, MD

## 2017-10-16 NOTE — Assessment & Plan Note (Signed)
Chronic, stable. Continue current regimen. 

## 2017-10-23 ENCOUNTER — Other Ambulatory Visit: Payer: BC Managed Care – PPO

## 2017-10-23 DIAGNOSIS — Z1211 Encounter for screening for malignant neoplasm of colon: Secondary | ICD-10-CM

## 2017-10-23 LAB — FECAL OCCULT BLOOD, IMMUNOCHEMICAL: FECAL OCCULT BLD: NEGATIVE

## 2017-10-23 LAB — FECAL OCCULT BLOOD, GUAIAC: Fecal Occult Blood: NEGATIVE

## 2017-10-26 ENCOUNTER — Encounter: Payer: Self-pay | Admitting: Family Medicine

## 2018-10-14 ENCOUNTER — Other Ambulatory Visit: Payer: BC Managed Care – PPO

## 2018-10-19 ENCOUNTER — Encounter: Payer: BC Managed Care – PPO | Admitting: Family Medicine

## 2019-01-10 ENCOUNTER — Other Ambulatory Visit: Payer: Self-pay | Admitting: Family Medicine

## 2019-01-10 NOTE — Telephone Encounter (Signed)
Please review refill request for Amlodipine. LOV 10/16/2017. Future appointment scheduled for 05/03/2019.

## 2019-01-11 ENCOUNTER — Other Ambulatory Visit: Payer: BC Managed Care – PPO

## 2019-01-20 ENCOUNTER — Encounter: Payer: BC Managed Care – PPO | Admitting: Family Medicine

## 2019-04-26 ENCOUNTER — Other Ambulatory Visit: Payer: BC Managed Care – PPO

## 2019-05-03 ENCOUNTER — Encounter: Payer: BC Managed Care – PPO | Admitting: Family Medicine

## 2019-06-13 ENCOUNTER — Encounter: Payer: Self-pay | Admitting: Family Medicine

## 2019-06-16 ENCOUNTER — Encounter: Payer: Self-pay | Admitting: Family Medicine

## 2019-06-16 NOTE — Telephone Encounter (Signed)
Ok to schedule virtual CPE. plz notify patient.

## 2019-06-16 NOTE — Telephone Encounter (Signed)
Called pt and lvm that I changed appt type to MyChart video visit and gave him information for the labs. I let him know if he have any questions regarding appt he could call us back.

## 2019-06-18 ENCOUNTER — Encounter: Payer: Self-pay | Admitting: Family Medicine

## 2019-07-13 ENCOUNTER — Other Ambulatory Visit: Payer: Self-pay | Admitting: Family Medicine

## 2019-07-13 DIAGNOSIS — I10 Essential (primary) hypertension: Secondary | ICD-10-CM

## 2019-07-13 DIAGNOSIS — Z125 Encounter for screening for malignant neoplasm of prostate: Secondary | ICD-10-CM

## 2019-07-13 NOTE — Addendum Note (Signed)
Addended by: Ria Bush on: 07/13/2019 10:53 PM   Modules accepted: Orders

## 2019-07-14 ENCOUNTER — Other Ambulatory Visit: Payer: Self-pay

## 2019-07-14 ENCOUNTER — Other Ambulatory Visit (INDEPENDENT_AMBULATORY_CARE_PROVIDER_SITE_OTHER): Payer: BC Managed Care – PPO

## 2019-07-14 DIAGNOSIS — I10 Essential (primary) hypertension: Secondary | ICD-10-CM | POA: Diagnosis not present

## 2019-07-14 DIAGNOSIS — Z125 Encounter for screening for malignant neoplasm of prostate: Secondary | ICD-10-CM | POA: Diagnosis not present

## 2019-07-14 LAB — COMPREHENSIVE METABOLIC PANEL
ALT: 28 U/L (ref 0–53)
AST: 21 U/L (ref 0–37)
Albumin: 4.6 g/dL (ref 3.5–5.2)
Alkaline Phosphatase: 96 U/L (ref 39–117)
BUN: 13 mg/dL (ref 6–23)
CO2: 31 mEq/L (ref 19–32)
Calcium: 10.1 mg/dL (ref 8.4–10.5)
Chloride: 103 mEq/L (ref 96–112)
Creatinine, Ser: 1.06 mg/dL (ref 0.40–1.50)
GFR: 72.79 mL/min (ref >60.00)
Glucose, Bld: 101 mg/dL — ABNORMAL HIGH (ref 70–99)
Potassium: 5.3 mEq/L — ABNORMAL HIGH (ref 3.5–5.1)
Sodium: 140 mEq/L (ref 135–145)
Total Bilirubin: 0.7 mg/dL (ref 0.2–1.2)
Total Protein: 6.7 g/dL (ref 6.0–8.3)

## 2019-07-14 LAB — LIPID PANEL
Cholesterol: 154 mg/dL (ref 0–200)
HDL: 43.2 mg/dL (ref >39.00)
LDL Cholesterol: 94 mg/dL (ref 0–99)
NonHDL: 110.9
Total CHOL/HDL Ratio: 4
Triglycerides: 83 mg/dL (ref 0.0–149.0)
VLDL: 16.6 mg/dL (ref 0.0–40.0)

## 2019-07-14 LAB — PSA: PSA: 0.73 ng/mL (ref 0.10–4.00)

## 2019-07-14 LAB — TSH: TSH: 1.65 u[IU]/mL (ref 0.35–4.50)

## 2019-07-22 ENCOUNTER — Telehealth (INDEPENDENT_AMBULATORY_CARE_PROVIDER_SITE_OTHER): Payer: BC Managed Care – PPO | Admitting: Family Medicine

## 2019-07-22 ENCOUNTER — Other Ambulatory Visit: Payer: Self-pay

## 2019-07-22 ENCOUNTER — Encounter: Payer: Self-pay | Admitting: Family Medicine

## 2019-07-22 VITALS — BP 161/88 | HR 62 | Temp 98.7°F | Ht 68.75 in | Wt 177.0 lb

## 2019-07-22 DIAGNOSIS — J4520 Mild intermittent asthma, uncomplicated: Secondary | ICD-10-CM

## 2019-07-22 DIAGNOSIS — Z0000 Encounter for general adult medical examination without abnormal findings: Secondary | ICD-10-CM

## 2019-07-22 DIAGNOSIS — Z1211 Encounter for screening for malignant neoplasm of colon: Secondary | ICD-10-CM | POA: Diagnosis not present

## 2019-07-22 DIAGNOSIS — K219 Gastro-esophageal reflux disease without esophagitis: Secondary | ICD-10-CM | POA: Diagnosis not present

## 2019-07-22 DIAGNOSIS — E663 Overweight: Secondary | ICD-10-CM

## 2019-07-22 DIAGNOSIS — I10 Essential (primary) hypertension: Secondary | ICD-10-CM

## 2019-07-22 MED ORDER — AMLODIPINE BESYLATE 5 MG PO TABS: 5.0000 mg | ORAL_TABLET | Freq: Every day | ORAL | 3 refills | Status: DC

## 2019-07-22 NOTE — Assessment & Plan Note (Signed)
Preventative protocols reviewed and updated unless pt declined. Discussed healthy diet and lifestyle.  

## 2019-07-22 NOTE — Assessment & Plan Note (Signed)
Congratulated on weight loss noted. Pt motivated to continue healthy diet changes.

## 2019-07-22 NOTE — Progress Notes (Signed)
Virtual visit completed through MyChart. Due to national recommendations of social distancing due to COVID-19, a virtual visit is felt to be most appropriate for this patient at this time. Reviewed limitations of a virtual visit.   Patient location: home Provider location: [place] at [facility], office If any vitals were documented, they were collected by patient at home unless specified below.    BP (!) 161/88 (BP Location: Right Arm, Cuff Size: Normal)   Pulse 62   Temp 98.7 F (37.1 C)   Ht 5' 8.75" (1.746 m)   Wt 177 lb (80.3 kg)   BMI 26.33 kg/m   BP Readings from Last 3 Encounters:  07/22/19 (!) 161/88  10/16/17 140/66  10/10/16 (!) 164/80   CC: CPE Subjective:    Patient ID: Melvin Hall, male    DOB: 1965-11-20, 53 y.o.   MRN: [id number]  HPI: Melvin Hall is a 53 y.o. male presenting on 07/22/2019 for Annual Exam   H/o white coat hypertension. Elevated on first check at home.   Elevated K - he does eat banana daily - will mix this with other fruits.  Weight loss - cut out ice cream, soft drinks, etc.   Preventative: Colon cancer screening - discussed. Would like iFOB - will mail Prostate cancer screening -PSA yearly. We discussed DRE - will defer for now given low risk.  Declines flu shot Tdap 05/2013 shingrix - discussed Seat belt use discussed Sunscreen use discussed.Nochanging moles on skin. Non smoker  Alcohol - none  Dentist yearly Eye exam Q 2 years  Lives with wife. Outside dogs. 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 Interim medical history since our last visit reviewed. Allergies and medications reviewed and updated. Outpatient Medications Prior to Visit  Medication Sig Dispense Refill  . amLODipine (NORVASC) 5 MG tablet TAKE 1 TABLET BY  MOUTH DAILY 90 tablet 1   No facility-administered medications prior to visit.      Per HPI unless specifically indicated in ROS section below Review of Systems  Constitutional: Negative for activity change, appetite change, chills, fatigue, fever and unexpected weight change.  HENT: Negative for hearing loss.   Eyes: Negative for visual disturbance.  Respiratory: Negative for cough, chest tightness, shortness of breath and wheezing.   Cardiovascular: Negative for chest pain, palpitations and leg swelling.  Gastrointestinal: Negative for abdominal distention, abdominal pain, blood in stool, constipation, diarrhea, nausea and vomiting.  Genitourinary: Negative for difficulty urinating and hematuria.  Musculoskeletal: Negative for arthralgias, myalgias and neck pain.  Skin: Negative for rash.  Neurological: Negative for dizziness, seizures, syncope and headaches.  Hematological: Negative for adenopathy. Does not bruise/bleed easily.  Psychiatric/Behavioral: Negative for dysphoric mood. The patient is not nervous/anxious.    Objective:    BP (!) 161/88 (BP Location: Right Arm, Cuff Size: Normal)   Pulse 62   Temp 98.7 F (37.1 C)   Ht 5' 8.75" (1.746 m)   Wt 177 lb (80.3 kg)   BMI 26.33 kg/m   Wt Readings from Last 3 Encounters:  07/22/19 177 lb (80.3 kg)  10/16/17 193 lb (87.5 kg)  10/10/16 196 lb (88.9 kg)     Physical exam: Gen: alert, NAD, not ill appearing Pulm: speaks in complete sentences without increased work of breathing Psych: normal mood, normal thought  content      Results for orders placed or performed in visit on 07/14/19  TSH  Result Value Ref Range   TSH 1.65 0.35 - 4.50 uIU/mL  PSA  Result Value Ref Range   PSA 0.73 0.10 - 4.00 ng/mL  Comprehensive metabolic panel  Result Value Ref Range   Sodium 140 135 - 145 mEq/L   Potassium 5.3 No hemolysis seen (H) 3.5 - 5.1 mEq/L   Chloride 103 96 - 112 mEq/L   CO2 31 19 - 32 mEq/L   Glucose, Bld 101 (H) 70  - 99 mg/dL   BUN 13 6 - 23 mg/dL   Creatinine, Ser 1.06 0.40 - 1.50 mg/dL   Total Bilirubin 0.7 0.2 - 1.2 mg/dL   Alkaline Phosphatase 96 39 - 117 U/L   AST 21 0 - 37 U/L   ALT 28 0 - 53 U/L   Total Protein 6.7 6.0 - 8.3 g/dL   Albumin 4.6 3.5 - 5.2 g/dL   Calcium 10.1 8.4 - 10.5 mg/dL   GFR 72.79 >60.00 mL/min  Lipid panel  Result Value Ref Range   Cholesterol 154 0 - 200 mg/dL   Triglycerides 83.0 0.0 - 149.0 mg/dL   HDL 43.20 >39.00 mg/dL   VLDL 16.6 0.0 - 40.0 mg/dL   LDL Cholesterol 94 0 - 99 mg/dL   Total CHOL/HDL Ratio 4    NonHDL 110.90    Assessment & Plan:   Problem List Items Addressed This Visit    White coat syndrome with hypertension    BP elevated today, pt attributes to doing virtual physical. Pt declines increased med at this time but agrees to monitor BPs closely over next 1-2 wks and send me log over MyChart to titrate accordingly.       Relevant Medications   amLODipine (NORVASC) 5 MG tablet   Overweight with body mass index (BMI) 25.0-29.9    Congratulated on weight loss noted. Pt motivated to continue healthy diet changes.       Health maintenance examination - Primary    Preventative protocols reviewed and updated unless pt declined. Discussed healthy diet and lifestyle.       GERD   Extrinsic asthma    No recent flares - avoids dust, uses mask when mowing.        Other Visit Diagnoses    Special screening for malignant neoplasms, colon       Relevant Orders   Fecal occult blood, imunochemical       Meds ordered this encounter  Medications  . amLODipine (NORVASC) 5 MG tablet    Sig: Take 1 tablet (5 mg total) by mouth daily.    Dispense:  90 tablet    Refill:  3   Orders Placed This Encounter  Procedures  . Fecal occult blood, imunochemical    Standing Status:   Future    Standing Expiration Date:   07/21/2020    I discussed the assessment and treatment plan with the patient. The patient was provided an opportunity to ask  questions and all were answered. The patient agreed with the plan and demonstrated an understanding of the instructions. The patient was advised to call back or seek an in-person evaluation if the symptoms worsen or if the condition fails to improve as anticipated.  Follow up plan: Return in about 1 year (around 07/21/2020) for annual exam, prior fasting for blood work.  Ria Bush, MD

## 2019-07-22 NOTE — Assessment & Plan Note (Signed)
No recent flares - avoids dust, uses mask when mowing.

## 2019-07-22 NOTE — Assessment & Plan Note (Signed)
BP elevated today, pt attributes to doing virtual physical. Pt declines increased med at this time but agrees to monitor BPs closely over next 1-2 wks and send me log over MyChart to titrate accordingly.

## 2019-08-11 ENCOUNTER — Encounter: Payer: Self-pay | Admitting: Family Medicine

## 2019-11-25 ENCOUNTER — Telehealth: Payer: Self-pay | Admitting: Radiology

## 2019-11-25 NOTE — Telephone Encounter (Signed)
Due to the pandemic mail was held up and several IFOB kits were received in the [facility] lab on approximately January 20th 2021 dating back as far as Septometer 2020. New IFOB kits have been mailed as of November 24, 2019 to all patients affected along with written communication as to why they received another IFOB and that they were not charges for the prior test.   

## 2019-12-14 ENCOUNTER — Encounter: Payer: Self-pay | Admitting: Emergency Medicine

## 2019-12-14 ENCOUNTER — Ambulatory Visit
Admission: EM | Admit: 2019-12-14 | Discharge: 2019-12-14 | Disposition: A | Discharge status: Home / Self Care | Payer: BC Managed Care – PPO | Attending: Family Medicine | Admitting: Family Medicine

## 2019-12-14 ENCOUNTER — Telehealth: Payer: Self-pay

## 2019-12-14 ENCOUNTER — Other Ambulatory Visit: Payer: Self-pay

## 2019-12-14 DIAGNOSIS — W540XXA Bitten by dog, initial encounter: Secondary | ICD-10-CM | POA: Diagnosis not present

## 2019-12-14 DIAGNOSIS — Z23 Encounter for immunization: Secondary | ICD-10-CM | POA: Diagnosis not present

## 2019-12-14 DIAGNOSIS — S80811A Abrasion, right lower leg, initial encounter: Secondary | ICD-10-CM

## 2019-12-14 MED ORDER — TETANUS-DIPHTH-ACELL PERTUSSIS 5-2.5-18.5 LF-MCG/0.5 IM SUSP
0.5000 mL | Freq: Once | INTRAMUSCULAR | Status: AC
  Administered 2019-12-14: 0.5 mL via INTRAMUSCULAR

## 2019-12-14 MED ORDER — AMOXICILLIN-POT CLAVULANATE 875-125 MG PO TABS: 1.0000 | ORAL_TABLET | Freq: Two times a day (BID) | ORAL | 0 refills | Status: DC

## 2019-12-14 NOTE — Telephone Encounter (Signed)
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 Pt said dog had rabies vaccination 05/2019. Pt last tdap was 06/02/2013. Pt and pts wife said pt did not bleed but skin was broken on lower leg about 8" above ankle on inside of leg.  The actual broken skin area is 1 1/2" x 2", does appear blue and swollen. pts wife wants to know if pt needs tetanus shot and abx. Pt does need tetanus shot and pt will go to [facility] in [place] now for eval and any necessary vaccines and or medications. FYI to Dr Reece Agar.

## 2019-12-14 NOTE — Discharge Instructions (Signed)
Antibiotic as prescribed.  Take care  Dr. Erynn Vaca  

## 2019-12-14 NOTE — ED Provider Notes (Signed)
[facility]    CSN: [id number] Arrival date & time: 12/14/19  1732      History   Chief Complaint Chief Complaint  Patient presents with  . Animal Bite   HPI  54 year old male presents with a dog bite.  Bitten by a dog earlier today - right lower leg. Dog got out of someone's house and bit him. Information about location was given to nursing staff. No bleeding. Needs updated tetanus. No pain currently. No other associated symptoms. No other complaints.  Past Medical History:  Diagnosis Date  . Allergic rhinitis   . Asthma   . Family history of GERD   . HTN (hypertension)   . Injury of shoulder, left     Patient Active Problem List   Diagnosis Date Noted  . Overweight with body mass index (BMI) 25.0-29.9 10/10/2016  . Health maintenance examination 09/22/2014  . White coat syndrome with hypertension 12/14/2012  . Allergic rhinitis 12/22/2007  . Extrinsic asthma 12/22/2007  . GERD 12/22/2007    Past Surgical History:  Procedure Laterality Date  . TONSILLECTOMY         Home Medications    Prior to Admission medications   Medication Sig Start Date End Date Taking? Authorizing Provider  amLODipine (NORVASC) 5 MG tablet Take 1 tablet (5 mg total) by mouth daily. 07/22/19  Yes Ria Bush, MD  amoxicillin-clavulanate (AUGMENTIN) 875-125 MG tablet Take 1 tablet by mouth every 12 (twelve) hours. 12/14/19   Coral Spikes, DO    Family History Family History  Problem Relation Age of Onset  . Other Mother        unknown medical history  . Hypertension Father   . Kidney failure Father   . Arthritis Father   . Hypertension Sister   . Cancer Paternal Uncle        melanoma  . CAD Neg Hx   . Diabetes Neg Hx   . Stroke Neg Hx     Social History Social History   Tobacco Use  . Smoking status: Never Smoker  . Smokeless tobacco: Never Used  Substance Use Topics  . Alcohol use: No    Alcohol/week: 0.0 standard drinks  . Drug use: No      Allergies   Patient has no known allergies.   Review of Systems Review of Systems  Constitutional: Negative.   Skin: Positive for wound.   Physical Exam Triage Vital Signs ED Triage Vitals [12/14/19 1757]  Enc Vitals Group     BP (!) 152/92     Pulse Rate 70     Resp 18     Temp 98.3 F (36.8 C)     Temp Source Oral     SpO2 98 %     Weight 175 lb (79.4 kg)     Height 5\' 7"  (1.702 m)     Head Circumference      Peak Flow      Pain Score 0     Pain Loc      Pain Edu?      Excl. in [place]?    Updated Vital Signs BP (!) 152/92 (BP Location: Left Arm)   Pulse 70   Temp 98.3 F (36.8 C) (Oral)   Resp 18   Ht 5\' 7"  (1.702 m)   Wt 79.4 kg   SpO2 98%   BMI 27.41 kg/m   Visual Acuity Right Eye Distance:   Left Eye Distance:   Bilateral Distance:  Right Eye Near:   Left Eye Near:    Bilateral Near:     Physical Exam Vitals and nursing note reviewed.  Constitutional:      General: He is not in acute distress.    Appearance: Normal appearance. He is not ill-appearing.  HENT:     Head: Normocephalic and atraumatic.  Eyes:     General:        Right eye: No discharge.        Left eye: No discharge.     Conjunctiva/sclera: Conjunctivae normal.  Cardiovascular:     Rate and Rhythm: Normal rate and regular rhythm.     Heart sounds: No murmur.  Pulmonary:     Effort: Pulmonary effort is normal.     Breath sounds: Normal breath sounds. No wheezing, rhonchi or rales.  Skin:    Comments: Small wound that appears to be more consistent with abrasion than puncture - R lower leg.  Neurological:     Mental Status: He is alert.  Psychiatric:        Mood and Affect: Mood normal.        Behavior: Behavior normal.    UC Treatments / Results  Labs (all labs ordered are listed, but only abnormal results are displayed) Labs Reviewed - No data to display  EKG   Radiology No results found.  Procedures Procedures (including critical care time)  Medications  Ordered in UC Medications  Tdap (BOOSTRIX) injection 0.5 mL (0.5 mLs Intramuscular Given 12/14/19 1804)    Initial Impression / Assessment and Plan / UC Course  I have reviewed the triage vital signs and the nursing notes.  Pertinent labs & imaging results that were available during my care of the patient were reviewed by me and considered in my medical decision making (see chart for details).    53 year old male presents with dog bite. Tetanus given. Antibiotic prophylaxis. Supportive care.  Final Clinical Impressions(s) / UC Diagnoses   Final diagnoses:  Dog bite, initial encounter     Discharge Instructions     Antibiotic as prescribed.  Take care  Dr. Adriana Simas    ED Prescriptions    Medication Sig Dispense Auth. Provider   amoxicillin-clavulanate (AUGMENTIN) 875-125 MG tablet Take 1 tablet by mouth every 12 (twelve) hours. 10 tablet Tommie Sams, DO     PDMP not reviewed this encounter.   Tommie Sams, [place] 12/14/19 1936

## 2019-12-14 NOTE — ED Triage Notes (Signed)
Patient in today after getting bitten by a dog on his right lower leg. The area did not bleed and barely broke the skin. Patient's last Tdap was 06/02/13. 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

## 2019-12-15 NOTE — Telephone Encounter (Signed)
Noted! Thank you

## 2020-06-12 ENCOUNTER — Encounter: Payer: Self-pay | Admitting: Family Medicine

## 2020-06-12 ENCOUNTER — Telehealth (INDEPENDENT_AMBULATORY_CARE_PROVIDER_SITE_OTHER): Payer: BC Managed Care – PPO | Admitting: Family Medicine

## 2020-06-12 ENCOUNTER — Other Ambulatory Visit: Payer: Self-pay | Admitting: Oncology

## 2020-06-12 DIAGNOSIS — U071 COVID-19: Secondary | ICD-10-CM

## 2020-06-12 HISTORY — DX: COVID-19: U07.1

## 2020-06-12 NOTE — Assessment & Plan Note (Addendum)
Ongoing fever malaise and cough. Completed azithromycin and prednisone course. Seems stable for continued outpatient therapy. I do think he may qualify for mAb infusion - will notify infusion team to review patient. Today is day 10 of symptoms so he is at end of window to qualify for infusion treatment. Ongoing supportive care reviewed as well as red flags to seek in person care at ER. In interim discussed alternating tylenol and ibuprofen for fever control.

## 2020-06-12 NOTE — Progress Notes (Addendum)
Virtual visit completed through MyChart, a video enabled telemedicine application. Due to national recommendations of social distancing due to COVID-19, a virtual visit is felt to be most appropriate for this patient at this time. Reviewed limitations, risks, security and privacy concerns of performing a virtual visit and the availability of in person appointments. I also reviewed that there may be a patient responsible charge related to this service. The patient agreed to proceed.   Patient location: home Provider location: [place] at [facility], office Persons participating in this virtual visit: patient, provider, wife also present in call  If any vitals were documented, they were collected by patient at home unless specified below.    Pulse 74   Temp (!) 101 F (38.3 C)   Ht 5\' 7"  (1.702 m)   Wt 177 lb 5 oz (80.4 kg)   SpO2 96%   BMI 27.77 kg/m    CC: COVID-19 infection Subjective:    Patient ID: , male    DOB: 02-Dec-1965, 54 y.o.   MRN: [id number]  HPI: Melvin Hall is a 54 y.o. male presenting on 06/12/2020 for Fever (C/o fever- max 103.8, body aches, cough, chest congestion, trouble taking deep breath and loss of taste/smell.  Tested positve for COVID at [facility] 06/07/20.  Sxs started 06/02/20. Prescribed azithromycin and predinsone.  Meds did not help.  Taking Tylenol for fever.  )   First day of symptoms were 06/02/2020. Positive swab 06/07/2020 at [facility].  Wife with COVID dx 05/31/2020. Wife has recovered.   Endorses ST, PNdrainage, cough, body aches, loss of taste/smell. Seen at [facility] and treated for acute sinusitis and bronchitis with zpack, prednisone course, tessalon perls. No significant benefit.   Predominant concern is ongoing fever spiking in the morning and afternoons. 101-103 every day. Other symptoms also persist. No chest pain or significant dyspnea. O2 sats maintaining >95%.  Taking tylenol 1000mg  BID.  Not taking NSAIDs.      Relevant past  medical, surgical, family and social history reviewed and updated as indicated. Interim medical history since our last visit reviewed. Allergies and medications reviewed and updated. Outpatient Medications Prior to Visit  Medication Sig Dispense Refill  . amLODipine (NORVASC) 5 MG tablet Take 1 tablet (5 mg total) by mouth daily. 90 tablet 3  . amoxicillin-clavulanate (AUGMENTIN) 875-125 MG tablet Take 1 tablet by mouth every 12 (twelve) hours. 10 tablet 0   No facility-administered medications prior to visit.     Per HPI unless specifically indicated in ROS section below Review of Systems Objective:  Pulse 74   Temp (!) 101 F (38.3 C)   Ht 5\' 7"  (1.702 m)   Wt 177 lb 5 oz (80.4 kg)   SpO2 96%   BMI 27.77 kg/m   Wt Readings from Last 3 Encounters:  06/12/20 177 lb 5 oz (80.4 kg)  12/14/19 175 lb (79.4 kg)  07/22/19 177 lb (80.3 kg)       Physical exam: Gen: alert, NAD, not ill appearing Pulm: speaks in complete sentences without increased work of breathing Psych: normal mood, normal thought content      Assessment & Plan:   Problem List Items Addressed This Visit    COVID-19 virus infection    Ongoing fever malaise and cough. Completed azithromycin and prednisone course. Seems stable for continued outpatient therapy. I do think he may qualify for mAb infusion - will notify infusion team to review patient. Today is day 10 of symptoms so he is  at end of window to qualify for infusion treatment. Ongoing supportive care reviewed as well as red flags to seek in person care at ER. In interim discussed alternating tylenol and ibuprofen for fever control.           No orders of the defined types were placed in this encounter.  No orders of the defined types were placed in this encounter.   I discussed the assessment and treatment plan with the patient. The patient was provided an opportunity to ask questions and all were answered. The patient agreed with the plan and  demonstrated an understanding of the instructions. The patient was advised to call back or seek an in-person evaluation if the symptoms worsen or if the condition fails to improve as anticipated.  Follow up plan: No follow-ups on file.  Eustaquio Boyden, MD

## 2020-06-12 NOTE — Progress Notes (Signed)
Re: Mab infusion   Called to Discuss with patient about Covid symptoms and the use of regeneron, a monoclonal antibody infusion for those with mild to moderate Covid symptoms and at a high risk of hospitalization.     Pt is qualified for this infusion at the [facility] infusion center due to co-morbid conditions and/or a member of an at-risk group.     Spoke to Mr. Saner's wife about the infusion and the benefits to it.  He is currently on day 10.  Patient has chosen  to decline the infusion at this time.  Mignon Pine, AGNP-C [phone] (Infusion Center Hotline)

## 2020-06-14 ENCOUNTER — Telehealth: Payer: Self-pay | Admitting: Family Medicine

## 2020-06-14 NOTE — Telephone Encounter (Signed)
Spoke with pt asking for update.   States he is feeling much better.  Cough has subsided and is able to taste food now.  Still c/o low-grade fever- 100 today, no sense of smell and minor body aches.  Pt is still taking alternating Tylenol and ibuprofen.  He's asking when can he stop the meds.  Pt is aware Dr. Reece Agar is out of the office.  Requests advise via MyChart.

## 2020-06-14 NOTE — Telephone Encounter (Signed)
Glad he's doing better.  Noted he declined mAb infusion.  Ok to stop alternating tylenol/ibuprofen at this time. Would just do this to control fever, if fever improving doesn't need to take meds.

## 2020-06-14 NOTE — Telephone Encounter (Signed)
Spoke with pt's wife notifying there is no DPR allowing [place] to speak with her about pt's med info.  Says she will have pt call back.

## 2020-06-14 NOTE — Telephone Encounter (Signed)
Spoke with pt relaying Dr. G's message.  Pt verbalizes understanding and expresses his thanks.  

## 2020-06-14 NOTE — Telephone Encounter (Signed)
Patient's wife came in office wanting to know if Misty Stanley could give her a call when she a chance regarding her husband. She also stated patient was suppose to get a follow up with Dr.G soon and was wondering when that was suppose to happen. Please call patient when you have a chance [phone].

## 2020-06-15 MED ORDER — ALBUTEROL SULFATE HFA 108 (90 BASE) MCG/ACT IN AERS: 2.0000 | INHALATION_SPRAY | Freq: Four times a day (QID) | RESPIRATORY_TRACT | 2 refills | Status: DC | PRN

## 2020-06-15 NOTE — Telephone Encounter (Signed)
Spoke with pt relaying Dr. G's message.  Pt verbalizes understanding and expresses his thanks.  

## 2020-06-15 NOTE — Telephone Encounter (Signed)
Refilled. Ok to take ibuprofen or tylenol for fever.  Would hold med unless fever >101.  However, if fever goes above 101, to let us know as that would be another reason to seek in person evaluation (another sign of possible pneumonia).

## 2020-06-15 NOTE — Telephone Encounter (Signed)
May be developing COVID pneumonia.  Does he have albuterol inhaler? Would rec try this for dsysnea.  If worsening trouble breathing despite this, rec [facility]CC visit.  If O2 sat drops below 90% associated with shortness of breath, rec he go to ER for evaluation.

## 2020-06-15 NOTE — Telephone Encounter (Addendum)
Melvin Hall (DPR not signed) said pt still does not want infusion; pt is not having CP or SOB now; pt continues with prod cough with slight color to phlegm now. Melvin Hall is concerned because pt has hx of asthma and today pulse ox is averaging 93 %. Pt had video visit with DR G on 06/12/20. Pt is not in distress now and [facility] request cb after DR G reviews the note.

## 2020-06-15 NOTE — Telephone Encounter (Addendum)
Spoke with pt asking about albuterol inhaler.  Says he does not have a current one.  I relayed Dr. Timoteo Expose message.  He verbalizes understanding and requests inhaler be sent to [facility].  Also, pt wants to make Dr. Reece Agar aware his fever was back to 100 this morning.  He is asking if it will be ok to take the Tylenol and/or ibuprofen with the inhaler. FYI to Dr. Reece Agar.

## 2020-06-15 NOTE — Telephone Encounter (Signed)
[facility] Day - Client TELEPHONE ADVICE RECORD [facility] Patient Name: Melvin Hall Gender: Male DOB: Jan 10, 1966 Age: 54 Y 11 M 18 D Return Phone Number: [phone] (Primary), [phone] (Secondary) Address: City/State/Zip: [place] [address] Client [facility] Day - Client Client Site [facility] - Day Physician Eustaquio Boyden - MD Contact Type Call Who Is Calling Patient / Member / Family / Caregiver Call Type Triage / Clinical Caller Name Tonnie Friedel Relationship To Patient Self Return Phone Number [phone] (Primary) Chief Complaint CHEST PAIN (>=21 years) - pain, pressure, heaviness or tightness Reason for Call Symptomatic / Request for Health Information Initial Comment Caller states her husband has COVID. His chest hurts, and his O2 is dropping. The O2 is currently 93. Translation No Nurse Assessment Nurse: Shelva Majestic, RN, Marchelle Folks Date/Time Lamount Cohen Time): 06/15/2020 8:14:59 AM Confirm and document reason for call. If symptomatic, describe symptoms. ---Caller states the pt has diff breathing and has Covid. Has the patient had close contact with a person known or suspected to have the novel coronavirus illness OR traveled / lives in area with major community spread (including international travel) in the last 14 days from the onset of symptoms? * If Asymptomatic, screen for exposure and travel within the last 14 days. ---Yes Does the patient have any new or worsening symptoms? ---Yes Will a triage be completed? ---Yes Related visit to physician within the last 2 weeks? ---Yes Does the PT have any chronic conditions? (i.e. diabetes, asthma, this includes High risk factors for pregnancy, etc.) ---Yes List chronic conditions. ---Asthma Is this a behavioral health or substance abuse call? ---No Guidelines Guideline Title Affirmed Question Affirmed Notes Nurse Date/Time (Eastern Time) COVID-19 -  Diagnosed or Suspected [1] COVID-19 infection suspected by caller or triager AND [2] mild symptoms (cough, fever, [place], RN, Marchelle Folks 06/15/2020 8:18:41 AM PLEASE NOTE: All timestamps contained within this report are represented as [place] Standard Time. CONFIDENTIALTY NOTICE: This fax transmission is intended only for the addressee. It contains information that is legally privileged, confidential or otherwise protected from use or disclosure. If you are not the intended recipient, you are strictly prohibited from reviewing, disclosing, copying using or disseminating any of this information or taking any action in reliance on or regarding this information. If you have received this fax in error, please notify us immediately by telephone so that we can arrange for its return to [place]. Phone: [phone], Toll-Free: [phone], Fax: [phone] Page: 2 of 2 Call Id: [id number] Guidelines Guideline Title Affirmed Question Affirmed Notes Nurse Date/Time Lamount Cohen Time) or others) AND [3] no complications or SOB Disp. Time Lamount Cohen Time) Disposition Final User 06/15/2020 8:13:23 AM Send to Urgent Queue Ardis Hughs 06/15/2020 8:25:57 AM Call PCP when Office is Open Yes Shelva Majestic, RN, Earnestine Leys Disagree/Comply Comply Caller Understands Yes PreDisposition Call Doctor Care Advice Given Per Guideline CALL PCP WHEN OFFICE IS OPEN: GENERAL CARE ADVICE FOR COVID-19 SYMPTOMS: * The treatment is the same whether you have COVID-19, influenza or some other respiratory virus. COUGH MEDICINES: HUMIDIFIER: * If the air is dry, use a humidifier in the bedroom. CALL BACK IF: * Fever over 103 F (39.4 C) CARE ADVICE given per COVID-19 - DIAGNOSED OR SUSPECTED (Adult) guideline. Referrals REFERRED TO PCP OFFICE

## 2020-06-15 NOTE — Addendum Note (Signed)
Addended by: Eustaquio Boyden on: 06/15/2020 02:07 PM   Modules accepted: Orders

## 2020-07-24 ENCOUNTER — Encounter: Payer: Self-pay | Admitting: Family Medicine

## 2020-07-24 ENCOUNTER — Other Ambulatory Visit: Payer: Self-pay

## 2020-07-24 ENCOUNTER — Ambulatory Visit (INDEPENDENT_AMBULATORY_CARE_PROVIDER_SITE_OTHER): Payer: Self-pay | Admitting: Family Medicine

## 2020-07-24 VITALS — BP 170/82 | HR 87 | Temp 98.4°F | Ht 67.0 in | Wt 187.6 lb

## 2020-07-24 DIAGNOSIS — Z024 Encounter for examination for driving license: Secondary | ICD-10-CM | POA: Insufficient documentation

## 2020-07-24 NOTE — Patient Instructions (Addendum)
DOT Certificate not provided due to uncontrolled hypertension while on BP medication.  Encouraged to follow up with PCP for hypertension management.

## 2020-07-24 NOTE — Assessment & Plan Note (Signed)
DOT Certificate not provided due to uncontrolled HTN while on medication  Hearing test: Pass at 15' Vision: 20/20 R, 20/20 L, 20/20 Both Corrected  Urine 1.020, Neg Protein, Neg Glucose, Trace Hematuria  STOPBANG: 3/8

## 2020-07-24 NOTE — Progress Notes (Signed)
Subjective:    Patient ID: Melvin Hall, male    DOB: 01-Nov-1965, 54 y.o.   MRN: [id number]  Melvin Hall is a 54 y.o. male presenting on 07/24/2020 for Employment Physical (DOT Physical)   HPI  Mr. Tomes presents to clinic for DOT PE  Depression screen [facility] 2/9 10/16/2017 06/30/2013  Decreased Interest 0 0  Down, Depressed, Hopeless 0 0  PHQ - 2 Score 0 0    Social History   Tobacco Use  . Smoking status: Never Smoker  . Smokeless tobacco: Never Used  Vaping Use  . Vaping Use: Never used  Substance Use Topics  . Alcohol use: No    Alcohol/week: 0.0 standard drinks  . Drug use: No    Review of Systems  Constitutional: Negative.   HENT: Negative.   Eyes: Negative.   Respiratory: Negative.   Cardiovascular: Negative.   Gastrointestinal: Negative.   Endocrine: Negative.   Genitourinary: Negative.   Musculoskeletal: Negative.   Skin: Negative.   Allergic/Immunologic: Negative.   Neurological: Negative.   Hematological: Negative.   Psychiatric/Behavioral: Negative.    Per HPI unless specifically indicated above     Objective:    BP (!) 170/82 (BP Location: Right Arm, Patient Position: Sitting, Cuff Size: Normal)   Pulse 87   Temp 98.4 F (36.9 C) (Oral)   Ht 5\' 7"  (1.702 m)   Wt 187 lb 9.6 oz (85.1 kg)   SpO2 98%   BMI 29.38 kg/m   Wt Readings from Last 3 Encounters:  07/24/20 187 lb 9.6 oz (85.1 kg)  06/12/20 177 lb 5 oz (80.4 kg)  12/14/19 175 lb (79.4 kg)    Physical Exam Vitals reviewed.  Constitutional:      General: He is not in acute distress.    Appearance: Normal appearance. He is well-developed and well-groomed. He is not ill-appearing or toxic-appearing.  HENT:     Head: Normocephalic.     Right Ear: Tympanic membrane, ear canal and external ear normal. There is no impacted cerumen.     Left Ear: Tympanic membrane, ear canal and external ear normal. There is no impacted cerumen.     Nose: Nose normal. No congestion or rhinorrhea.      Mouth/Throat:     Mouth: Mucous membranes are moist.     Pharynx: Oropharynx is clear. No oropharyngeal exudate or posterior oropharyngeal erythema.  Eyes:     General: Lids are normal. Vision grossly intact. No scleral icterus.       Right eye: No discharge.        Left eye: No discharge.     Extraocular Movements: Extraocular movements intact.     Conjunctiva/sclera: Conjunctivae normal.     Pupils: Pupils are equal, round, and reactive to light.  Cardiovascular:     Rate and Rhythm: Normal rate and regular rhythm.     Pulses: Normal pulses.          Dorsalis pedis pulses are 2+ on the right side and 2+ on the left side.     Heart sounds: Normal heart sounds. No murmur heard.  No friction rub. No gallop.   Pulmonary:     Effort: Pulmonary effort is normal. No respiratory distress.     Breath sounds: Normal breath sounds. No wheezing, rhonchi or rales.  Abdominal:     General: Abdomen is flat. Bowel sounds are normal. There is no distension.     Palpations: Abdomen is soft. There is no hepatomegaly, splenomegaly or  mass.     Tenderness: There is no abdominal tenderness. There is no right CVA tenderness, left CVA tenderness, guarding or rebound.     Hernia: No hernia is present.  Musculoskeletal:        General: Normal range of motion.     Cervical back: Normal range of motion and neck supple. No rigidity or tenderness.     Right lower leg: No edema.     Left lower leg: No edema.     Comments: Normal tone, 5/5 strength BUE & BLE  Feet:     Right foot:     Skin integrity: Skin integrity normal.     Left foot:     Skin integrity: Skin integrity normal.  Lymphadenopathy:     Cervical: No cervical adenopathy.  Skin:    General: Skin is warm and dry.     Capillary Refill: Capillary refill takes less than 2 seconds.  Neurological:     General: No focal deficit present.     Mental Status: He is alert and oriented to person, place, and time.     Cranial Nerves: Cranial nerves are  intact. No cranial nerve deficit.     Sensory: Sensation is intact. No sensory deficit.     Motor: Motor function is intact. No weakness.     Coordination: Coordination is intact. Coordination normal.     Gait: Gait is intact. Gait normal.     Deep Tendon Reflexes: Reflexes are normal and symmetric. Reflexes normal.  Psychiatric:        Attention and Perception: Attention and perception normal.        Mood and Affect: Mood and affect normal.        Speech: Speech normal.        Behavior: Behavior normal. Behavior is cooperative.        Thought Content: Thought content normal.        Cognition and Memory: Cognition and memory normal.        Judgment: Judgment normal.    Results for orders placed or performed in visit on 07/14/19  TSH  Result Value Ref Range   TSH 1.65 0.35 - 4.50 uIU/mL  PSA  Result Value Ref Range   PSA 0.73 0.10 - 4.00 ng/mL  Comprehensive metabolic panel  Result Value Ref Range   Sodium 140 135 - 145 mEq/L   Potassium 5.3 No hemolysis seen (H) 3.5 - 5.1 mEq/L   Chloride 103 96 - 112 mEq/L   CO2 31 19 - 32 mEq/L   Glucose, Bld 101 (H) 70 - 99 mg/dL   BUN 13 6 - 23 mg/dL   Creatinine, Ser 1.74 0.40 - 1.50 mg/dL   Total Bilirubin 0.7 0.2 - 1.2 mg/dL   Alkaline Phosphatase 96 39 - 117 U/L   AST 21 0 - 37 U/L   ALT 28 0 - 53 U/L   Total Protein 6.7 6.0 - 8.3 g/dL   Albumin 4.6 3.5 - 5.2 g/dL   Calcium 08.1 8.4 - 44.8 mg/dL   GFR 18.56 >31.49 mL/min  Lipid panel  Result Value Ref Range   Cholesterol 154 0 - 200 mg/dL   Triglycerides 70.2 0 - 149 mg/dL   HDL 63.78 >58.85 mg/dL   VLDL 02.7 0.0 - 74.1 mg/dL   LDL Cholesterol 94 0 - 99 mg/dL   Total CHOL/HDL Ratio 4    NonHDL 110.90       Assessment & Plan:   Problem List Items Addressed This  Visit      Other   Encounter for Department of Transportation (DOT) examination for trucking license - Primary    DOT Certificate not provided due to uncontrolled HTN while on medication  Hearing test: Pass at  15' Vision: 20/20 R, 20/20 L, 20/20 Both Corrected  Urine 1.020, Neg Protein, Neg Glucose, Trace Hematuria  STOPBANG: 3/8          No orders of the defined types were placed in this encounter.  Follow up plan: Return for Once BP is well controlled.   Charlaine Dalton, FNP Family Nurse Practitioner [facility] 07/24/2020, 9:05 AM

## 2020-08-07 ENCOUNTER — Ambulatory Visit: Payer: BC Managed Care – PPO | Admitting: Family Medicine

## 2020-08-07 ENCOUNTER — Other Ambulatory Visit: Payer: Self-pay

## 2020-08-07 ENCOUNTER — Encounter: Payer: Self-pay | Admitting: Family Medicine

## 2020-08-07 VITALS — BP 170/86 | HR 85 | Temp 98.4°F | Ht 67.0 in | Wt 189.4 lb

## 2020-08-07 DIAGNOSIS — I10 Essential (primary) hypertension: Secondary | ICD-10-CM

## 2020-08-07 MED ORDER — AMLODIPINE BESYLATE 10 MG PO TABS: 10.0000 mg | ORAL_TABLET | Freq: Every day | ORAL | 3 refills | Status: DC

## 2020-08-07 NOTE — Progress Notes (Signed)
This visit was conducted in person.  BP (!) 170/86 (BP Location: Right Arm, Cuff Size: Large)   Pulse 85   Temp 98.4 F (36.9 C) (Temporal)   Ht 5\' 7"  (1.702 m)   Wt 189 lb 6 oz (85.9 kg)   SpO2 97%   BMI 29.66 kg/m   BP Readings from Last 3 Encounters:  08/07/20 (!) 170/86  07/24/20 (!) 170/82  12/14/19 (!) 152/92    CC: HTN Subjective:    Patient ID: 12/16/19, male    DOB: 07/27/1966, 54 y.o.   MRN: [id number]  HPI: Melvin Hall is a 54 y.o. male presenting on 08/07/2020 for Elevated Blood Pressure (Had DOT CPE about 2 wks ago and BP was elevated.  Pt told to see PCP.  Pt provided log [made copy] of recent home BP readings. )   Recent DOT physical - BP elevated at that time. This is despite amlodipine 5mg  daily. H/o white coat hypertension.   HTN - Compliant with current antihypertensive regimen of amlodipine 5mg  daily. Does check blood pressures at home and brings log: 130-140s/70-80s, HR 60-70s. He has automatic [employer] brand arm cuff at home. Denies low blood pressure readings or symptoms of dizziness/syncope. Denies HA, vision changes, CP/tightness, SOB, leg swelling.    COVID infection 05/2020 - did fully resolve.      Relevant past medical, surgical, family and social history reviewed and updated as indicated. Interim medical history since our last visit reviewed. Allergies and medications reviewed and updated. Outpatient Medications Prior to Visit  Medication Sig Dispense Refill  . albuterol (VENTOLIN HFA) 108 (90 Base) MCG/ACT inhaler Inhale 2 puffs into the lungs every 6 (six) hours as needed for wheezing or shortness of breath. 8 g 2  . amLODipine (NORVASC) 5 MG tablet Take 1 tablet (5 mg total) by mouth daily. 90 tablet 3   No facility-administered medications prior to visit.     Per HPI unless specifically indicated in ROS section below Review of Systems Objective:  BP (!) 170/86 (BP Location: Right Arm, Cuff Size: Large)   Pulse 85   Temp 98.4 F  (36.9 C) (Temporal)   Ht 5\' 7"  (1.702 m)   Wt 189 lb 6 oz (85.9 kg)   SpO2 97%   BMI 29.66 kg/m   Wt Readings from Last 3 Encounters:  08/07/20 189 lb 6 oz (85.9 kg)  07/24/20 187 lb 9.6 oz (85.1 kg)  06/12/20 177 lb 5 oz (80.4 kg)      Physical Exam Vitals and nursing note reviewed.  Constitutional:      Appearance: Normal appearance. He is not ill-appearing.  Cardiovascular:     Rate and Rhythm: Normal rate and regular rhythm.     Pulses: Normal pulses.     Heart sounds: Normal heart sounds. No murmur heard.   Pulmonary:     Effort: Pulmonary effort is normal. No respiratory distress.     Breath sounds: Normal breath sounds. No wheezing, rhonchi or rales.  Musculoskeletal:     Right lower leg: No edema.     Left lower leg: No edema.  Skin:    General: Skin is warm and dry.     Findings: No rash.  Neurological:     Mental Status: He is alert.  Psychiatric:        Mood and Affect: Mood normal.        Behavior: Behavior normal.       Assessment & Plan:  This visit  occurred during the SARS-CoV-2 public health emergency.  Safety protocols were in place, including screening questions prior to the visit, additional usage of staff PPE, and extensive cleaning of exam room while observing appropriate contact time as indicated for disinfecting solutions.   Problem List Items Addressed This Visit    White coat syndrome with hypertension - Primary    BP tends to run higher in office than at home, again verified based on home log he brings in. However even home readings running too high - will increase amlodipine to 10mg  daily. Provided with DASH diet handout. Encouraged low sodium, potassium rich diet to help control blood pressures as well.  Reassess in 2 wks at CPE.  I did ask him to bring BP cuff to physical to compare.       Relevant Medications   amLODipine (NORVASC) 10 MG tablet       Meds ordered this encounter  Medications  . amLODipine (NORVASC) 10 MG tablet     Sig: Take 1 tablet (10 mg total) by mouth daily.    Dispense:  90 tablet    Refill:  3   No orders of the defined types were placed in this encounter.   Patient instructions: Blood pressures are staying elevated.  Increase amlodipine to 10mg  daily. Watch for ankle swelling on higher dose.  Drink plenty of water, avoid salt/sodium in the diet, get potassium rich foods (fruits/vegetables, whole grains).  Return in 3-4 weeks for recheck blood pressure.   Follow up plan: Return if symptoms worsen or fail to improve.  , MD

## 2020-08-07 NOTE — Assessment & Plan Note (Addendum)
BP tends to run higher in office than at home, again verified based on home log he brings in. However even home readings running too high - will increase amlodipine to 10mg  daily. Provided with DASH diet handout. Encouraged low sodium, potassium rich diet to help control blood pressures as well.  Reassess in 2 wks at CPE.  I did ask him to bring BP cuff to physical to compare.

## 2020-08-07 NOTE — Patient Instructions (Addendum)
Blood pressures are staying elevated.  Increase amlodipine to 10mg  daily. Watch for ankle swelling on higher dose.  Drink plenty of water, avoid salt/sodium in the diet, get potassium rich foods (fruits/vegetables, whole grains).   DASH Eating Plan DASH stands for "Dietary Approaches to Stop Hypertension." The DASH eating plan is a healthy eating plan that has been shown to reduce high blood pressure (hypertension). It may also reduce your risk for type 2 diabetes, heart disease, and stroke. The DASH eating plan may also help with weight loss. What are tips for following this plan?  General guidelines  Avoid eating more than 2,300 mg (milligrams) of salt (sodium) a day. If you have hypertension, you may need to reduce your sodium intake to 1,500 mg a day.  Limit alcohol intake to no more than 1 drink a day for nonpregnant women and 2 drinks a day for men. One drink equals 12 oz of beer, 5 oz of wine, or 1 oz of hard liquor.  Work with your health care provider to maintain a healthy body weight or to lose weight. Ask what an ideal weight is for you.  Get at least 30 minutes of exercise that causes your heart to beat faster (aerobic exercise) most days of the week. Activities may include walking, swimming, or biking.  Work with your health care provider or diet and nutrition specialist (dietitian) to adjust your eating plan to your individual calorie needs. Reading food labels   Check food labels for the amount of sodium per serving. Choose foods with less than 5 percent of the Daily Value of sodium. Generally, foods with less than 300 mg of sodium per serving fit into this eating plan.  To find whole grains, look for the word "whole" as the first word in the ingredient list. Shopping  Buy products labeled as "low-sodium" or "no salt added."  Buy fresh foods. Avoid canned foods and premade or frozen meals. Cooking  Avoid adding salt when cooking. Use salt-free seasonings or herbs  instead of table salt or sea salt. Check with your health care provider or pharmacist before using salt substitutes.  Do not fry foods. Cook foods using healthy methods such as baking, boiling, grilling, and broiling instead.  Cook with heart-healthy oils, such as olive, canola, soybean, or sunflower oil. Meal planning  Eat a balanced diet that includes: ? 5 or more servings of fruits and vegetables each day. At each meal, try to fill half of your plate with fruits and vegetables. ? Up to 6-8 servings of whole grains each day. ? Less than 6 oz of lean meat, poultry, or fish each day. A 3-oz serving of meat is about the same size as a deck of cards. One egg equals 1 oz. ? 2 servings of low-fat dairy each day. ? A serving of nuts, seeds, or beans 5 times each week. ? Heart-healthy fats. Healthy fats called Omega-3 fatty acids are found in foods such as flaxseeds and coldwater fish, like sardines, salmon, and mackerel.  Limit how much you eat of the following: ? Canned or prepackaged foods. ? Food that is high in trans fat, such as fried foods. ? Food that is high in saturated fat, such as fatty meat. ? Sweets, desserts, sugary drinks, and other foods with added sugar. ? Full-fat dairy products.  Do not salt foods before eating.  Try to eat at least 2 vegetarian meals each week.  Eat more home-cooked food and less restaurant, buffet, and fast food.  When eating at a restaurant, ask that your food be prepared with less salt or no salt, if possible. What foods are recommended? The items listed may not be a complete list. Talk with your dietitian about what dietary choices are best for you. Grains Whole-grain or whole-wheat bread. Whole-grain or whole-wheat pasta. Brown rice. Orpah Cobb. Bulgur. Whole-grain and low-sodium cereals. Pita bread. Low-fat, low-sodium crackers. Whole-wheat flour tortillas. Vegetables Fresh or frozen vegetables (raw, steamed, roasted, or grilled).  Low-sodium or reduced-sodium tomato and vegetable juice. Low-sodium or reduced-sodium tomato sauce and tomato paste. Low-sodium or reduced-sodium canned vegetables. Fruits All fresh, dried, or frozen fruit. Canned fruit in natural juice (without added sugar). Meat and other protein foods Skinless chicken or [place]. Ground chicken or [place]. Pork with fat trimmed off. Fish and seafood. Egg whites. Dried beans, peas, or lentils. Unsalted nuts, nut butters, and seeds. Unsalted canned beans. Lean cuts of beef with fat trimmed off. Low-sodium, lean deli meat. Dairy Low-fat (1%) or fat-free (skim) milk. Fat-free, low-fat, or reduced-fat cheeses. Nonfat, low-sodium ricotta or cottage cheese. Low-fat or nonfat yogurt. Low-fat, low-sodium cheese. Fats and oils Soft margarine without trans fats. Vegetable oil. Low-fat, reduced-fat, or light mayonnaise and salad dressings (reduced-sodium). Canola, safflower, olive, soybean, and sunflower oils. Avocado. Seasoning and other foods Herbs. Spices. Seasoning mixes without salt. Unsalted popcorn and pretzels. Fat-free sweets. What foods are not recommended? The items listed may not be a complete list. Talk with your dietitian about what dietary choices are best for you. Grains Baked goods made with fat, such as croissants, muffins, or some breads. Dry pasta or rice meal packs. Vegetables Creamed or fried vegetables. Vegetables in a cheese sauce. Regular canned vegetables (not low-sodium or reduced-sodium). Regular canned tomato sauce and paste (not low-sodium or reduced-sodium). Regular tomato and vegetable juice (not low-sodium or reduced-sodium). Rosita Fire. Olives. Fruits Canned fruit in a light or heavy syrup. Fried fruit. Fruit in cream or butter sauce. Meat and other protein foods Fatty cuts of meat. Ribs. Fried meat. Tomasa Blase. Sausage. Bologna and other processed lunch meats. Salami. Fatback. Hotdogs. Bratwurst. Salted nuts and seeds. Canned beans with added  salt. Canned or smoked fish. Whole eggs or egg yolks. Chicken or [place] with skin. Dairy Whole or 2% milk, cream, and half-and-half. Whole or full-fat cream cheese. Whole-fat or sweetened yogurt. Full-fat cheese. Nondairy creamers. Whipped toppings. Processed cheese and cheese spreads. Fats and oils Butter. Stick margarine. Lard. Shortening. Ghee. Bacon fat. Tropical oils, such as coconut, palm kernel, or palm oil. Seasoning and other foods Salted popcorn and pretzels. Onion salt, garlic salt, seasoned salt, table salt, and sea salt. Worcestershire sauce. Tartar sauce. Barbecue sauce. Teriyaki sauce. Soy sauce, including reduced-sodium. Steak sauce. Canned and packaged gravies. Fish sauce. Oyster sauce. Cocktail sauce. Horseradish that you find on the shelf. Ketchup. Mustard. Meat flavorings and tenderizers. Bouillon cubes. Hot sauce and Tabasco sauce. Premade or packaged marinades. Premade or packaged taco seasonings. Relishes. Regular salad dressings. Where to find more information:  National Heart, Lung, and Blood Institute: PopSteam.is  American Heart Association: www.heart.org Summary  The DASH eating plan is a healthy eating plan that has been shown to reduce high blood pressure (hypertension). It may also reduce your risk for type 2 diabetes, heart disease, and stroke.  With the DASH eating plan, you should limit salt (sodium) intake to 2,300 mg a day. If you have hypertension, you may need to reduce your sodium intake to 1,500 mg a day.  When on the DASH eating plan,  aim to eat more fresh fruits and vegetables, whole grains, lean proteins, low-fat dairy, and heart-healthy fats.  Work with your health care provider or diet and nutrition specialist (dietitian) to adjust your eating plan to your individual calorie needs. This information is not intended to replace advice given to you by your health care provider. Make sure you discuss any questions you have with your health care  provider. Document Revised: 09/18/2017 Document Reviewed: 09/29/2016 Elsevier Patient Education  2020 [employer].

## 2020-08-14 ENCOUNTER — Other Ambulatory Visit: Payer: Self-pay | Admitting: Family Medicine

## 2020-08-14 DIAGNOSIS — I10 Essential (primary) hypertension: Secondary | ICD-10-CM

## 2020-08-14 DIAGNOSIS — Z125 Encounter for screening for malignant neoplasm of prostate: Secondary | ICD-10-CM

## 2020-08-14 DIAGNOSIS — Z1159 Encounter for screening for other viral diseases: Secondary | ICD-10-CM

## 2020-08-16 ENCOUNTER — Other Ambulatory Visit: Payer: BC Managed Care – PPO

## 2020-08-21 ENCOUNTER — Ambulatory Visit: Payer: BC Managed Care – PPO | Admitting: Family Medicine

## 2020-08-21 ENCOUNTER — Encounter: Payer: BC Managed Care – PPO | Admitting: Family Medicine

## 2020-08-21 ENCOUNTER — Other Ambulatory Visit: Payer: Self-pay

## 2020-08-21 ENCOUNTER — Encounter: Payer: Self-pay | Admitting: Family Medicine

## 2020-08-21 VITALS — BP 180/78 | HR 85 | Temp 97.8°F | Ht 67.0 in | Wt 191.0 lb

## 2020-08-21 DIAGNOSIS — I10 Essential (primary) hypertension: Secondary | ICD-10-CM

## 2020-08-21 NOTE — Assessment & Plan Note (Addendum)
Brings home readings which are well controlled on amlodipine 10mg  daily.  Brings home BP cuff which measures readings consistent with our home cuff.  He has treated white coat hypertension (white coat effect) - will continue amlodipine 10mg  daily.  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  He will return for CPE with labs.

## 2020-08-21 NOTE — Patient Instructions (Addendum)
Continue amlodipine 10mg  daily - you're tolerating well You do have white coat hypertension - so we will manage blood pressures based on home readings.  Letter for DOT provided today

## 2020-08-21 NOTE — Progress Notes (Signed)
This visit was conducted in person.  BP (!) 180/78 (BP Location: Right Arm, Patient Position: Sitting, Cuff Size: Normal)   Pulse 85   Temp 97.8 F (36.6 C) (Temporal)   Ht 5\' 7"  (1.702 m)   Wt 191 lb (86.6 kg)   SpO2 97%   BMI 29.91 kg/m    CC: HTN f/u visit  Subjective:    Patient ID: , male    DOB: 1966-04-14, 54 y.o.   MRN: [id number]  HPI: Melvin Hall is a 54 y.o. male presenting on 08/21/2020 for Hypertension (Here for 2 wk f/u. Pt provided log [made copy] of recent BP readings.  Also, brought in home BP monitor to compare.  Reading in office is 187/93.  )   True white coat hypertension as evidenced by home readings 127-140/70-80s consistently this past week, but today in office markedly elevated.   He continues amlodipine 10mg  daily.  He brought home BP cuff and readings are comparable to ours.   No HA, vision changes, CP/tightness, SOB, leg swelling. No low BP symptoms of dizziness or presyncope.   Last visit we increased amlodipine to 10mg  daily, tolerating well.   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      Relevant past medical, surgical, family and social history reviewed and updated as indicated. Interim medical history since our last visit reviewed. Allergies and medications reviewed and updated. Outpatient Medications Prior to Visit  Medication Sig Dispense Refill  . albuterol (VENTOLIN HFA) 108 (90 Base) MCG/ACT inhaler Inhale 2 puffs into the lungs every 6 (six) hours as needed for wheezing or shortness of breath. 8 g 2  . amLODipine (NORVASC) 10 MG tablet Take 1 tablet (10 mg total) by mouth daily. 90 tablet 3   No facility-administered medications prior to visit.     Per HPI unless specifically indicated in ROS section below Review of Systems Objective:  BP (!) 180/78 (BP Location: Right Arm, Patient Position: Sitting, Cuff Size: Normal)   Pulse 85   Temp 97.8 F (36.6 C) (Temporal)   Ht 5\' 7"  (1.702 m)   Wt 191  lb (86.6 kg)   SpO2 97%   BMI 29.91 kg/m   Wt Readings from Last 3 Encounters:  08/21/20 191 lb (86.6 kg)  08/07/20 189 lb 6 oz (85.9 kg)  07/24/20 187 lb 9.6 oz (85.1 kg)      Physical Exam Vitals and nursing note reviewed.  Constitutional:      Appearance: Normal appearance. He is not ill-appearing.  Cardiovascular:     Rate and Rhythm: Normal rate and regular rhythm.     Pulses: Normal pulses.     Heart sounds: Normal heart sounds. No murmur heard.   Pulmonary:     Effort: Pulmonary effort is normal. No respiratory distress.     Breath sounds: Normal breath sounds. No wheezing, rhonchi or rales.  Musculoskeletal:     Right lower leg: No edema.     Left lower leg: No edema.  Skin:    General: Skin is warm and dry.     Findings: No rash.  Neurological:     Mental Status: He is alert.  Psychiatric:        Mood and Affect: Mood normal.        Behavior: Behavior normal.       Assessment & Plan:  This visit occurred during the SARS-CoV-2 public health emergency.  Safety protocols were in place, including screening questions prior to  the visit, additional usage of staff PPE, and extensive cleaning of exam room while observing appropriate contact time as indicated for disinfecting solutions.   Problem List Items Addressed This Visit    White coat syndrome with hypertension - Primary    Brings home readings which are well controlled on amlodipine 10mg  daily.  Brings home BP cuff which measures readings consistent with our home cuff.  He has treated white coat hypertension (white coat effect) - will continue amlodipine 10mg  daily.  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  He will return for CPE with labs.           No orders of the defined types were placed in this encounter.  No orders of the defined types were placed in this encounter.   Patient Instructions  Continue amlodipine 10mg  daily - you're tolerating well You do have white coat hypertension -  so we will manage blood pressures based on home readings.  Letter for DOT provided today   Follow up plan: Return if symptoms worsen or fail to improve, for annual exam, prior fasting for blood work.  , MD

## 2020-11-16 DIAGNOSIS — I10 Essential (primary) hypertension: Secondary | ICD-10-CM | POA: Insufficient documentation

## 2020-11-16 DIAGNOSIS — J45909 Unspecified asthma, uncomplicated: Secondary | ICD-10-CM | POA: Insufficient documentation

## 2020-11-16 DIAGNOSIS — Z8616 Personal history of COVID-19: Secondary | ICD-10-CM | POA: Insufficient documentation

## 2021-12-11 ENCOUNTER — Encounter: Payer: Self-pay | Admitting: Urology

## 2021-12-11 ENCOUNTER — Ambulatory Visit: Payer: BC Managed Care – PPO | Admitting: Urology

## 2021-12-11 ENCOUNTER — Other Ambulatory Visit: Payer: Self-pay

## 2021-12-11 VITALS — BP 162/89 | HR 78 | Ht 66.0 in | Wt 195.5 lb

## 2021-12-11 DIAGNOSIS — R3129 Other microscopic hematuria: Secondary | ICD-10-CM

## 2021-12-11 NOTE — Progress Notes (Signed)
° °  12/11/21 2:22 PM   Melvin Hall Oct 02, 1966 [id number]  CC: Microscopic hematuria  HPI: I saw Melvin Hall today for evaluation of asymptomatic microscopic hematuria.  He had 4-10 RBCs on urine samples on 11/11/2021 and 11/18/2021 that were otherwise benign.  He denies any gross hematuria.  No smoking history or other carcinogenic exposures.  He denies any significant flank pain or dysuria or other urinary symptoms.   PMH: Past Medical History:  Diagnosis Date   Allergic rhinitis    Asthma    COVID-19 virus infection 06/12/2020   Family history of GERD    HTN (hypertension)    Injury of shoulder, left     Surgical History: Past Surgical History:  Procedure Laterality Date   TONSILLECTOMY       Family History: Family History  Problem Relation Age of Onset   Other Mother        unknown medical history   Hypertension Father    Kidney failure Father    Arthritis Father    Hypertension Sister    Cancer Paternal Uncle        melanoma   CAD Neg Hx    Diabetes Neg Hx    Stroke Neg Hx     Social History:  reports that he has never smoked. He has never used smokeless tobacco. He reports that he does not drink alcohol and does not use drugs.  Physical Exam: BP (!) 162/89 (BP Location: Left Arm, Patient Position: Sitting, Cuff Size: Large)    Pulse 78    Ht 5\' 6"  (1.676 m)    Wt 195 lb 8 oz (88.7 kg)    BMI 31.55 kg/m    Constitutional:  Alert and oriented, No acute distress. Cardiovascular: No clubbing, cyanosis, or edema. Respiratory: Normal respiratory effort, no increased work of breathing. GI: Abdomen is soft, nontender, nondistended, no abdominal masses  Assessment & Plan:   56 year old healthy male with no smoking history or other carcinogenic exposures with 4-10 RBCs on 2 urine samples.  We discussed common possible etiologies of microscopic hematuria including idiopathic, urolithiasis, medical renal disease, and malignancy. We discussed the new asymptomatic  microscopic hematuria guidelines and risk categories of low, intermediate, and high risk that are based on age, risk factors like smoking, and degree of microscopic hematuria. We discussed work-up can range from repeat urinalysis, renal ultrasound and cystoscopy, to CT urogram and cystoscopy.  -He falls into the low risk category, and using shared decision making he opted to start with renal/bladder ultrasound alone, and defer cystoscopy. -We will call with renal ultrasound results  Nickolas Madrid, MD 12/11/2021  [facility] [address] [place], [place] [address] [phone]

## 2021-12-16 ENCOUNTER — Ambulatory Visit
Admission: RE | Admit: 2021-12-16 | Discharge: 2021-12-16 | Disposition: A | Discharge status: Home / Self Care | Payer: BC Managed Care – PPO | Source: Ambulatory Visit | Attending: Urology | Admitting: Urology

## 2021-12-16 ENCOUNTER — Other Ambulatory Visit: Payer: Self-pay

## 2021-12-16 DIAGNOSIS — R3129 Other microscopic hematuria: Secondary | ICD-10-CM | POA: Insufficient documentation

## 2022-09-05 IMAGING — US US RENAL
1 series · 14 of 25 positions shown · non-contrast
Comparison: None.

CLINICAL DATA: Microscopic hematuria.

EXAM:
RENAL / URINARY TRACT ULTRASOUND COMPLETE

[Series 1: us renal · 0.23mm/px · 14 of 51 slices shown]
[im 1/51]
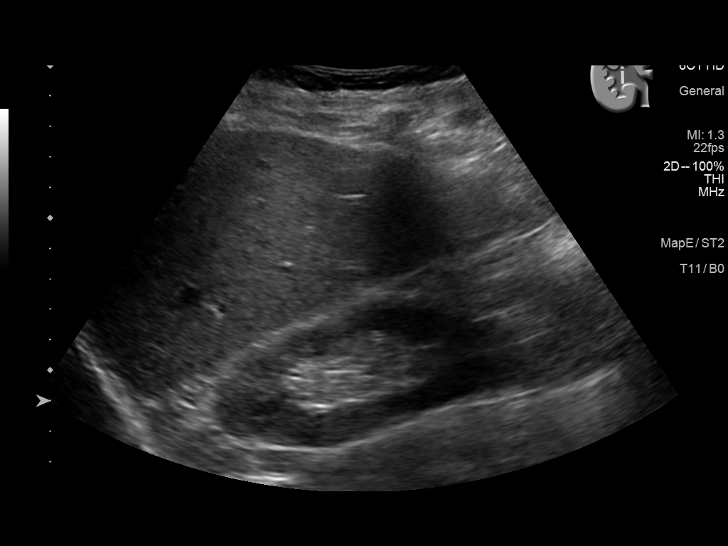
[im 5/51]
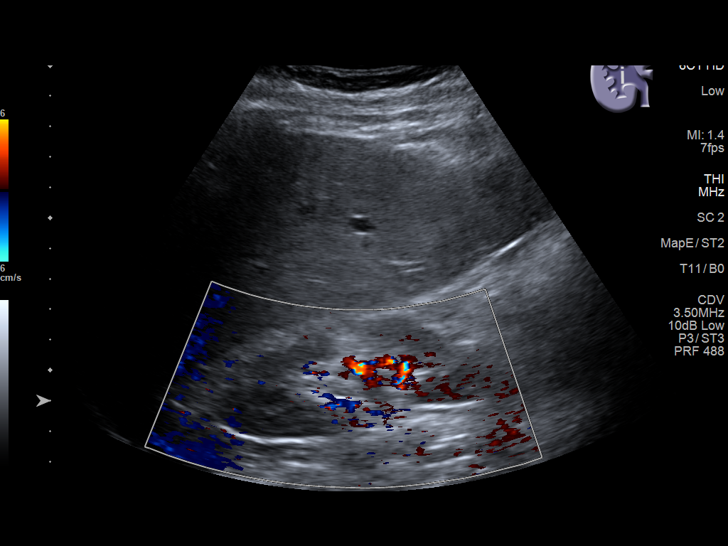
[im 9/51]
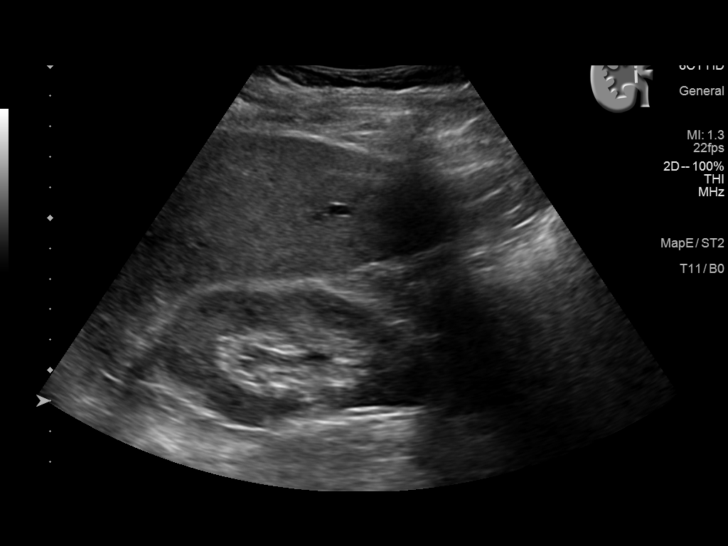
[im 13/51]
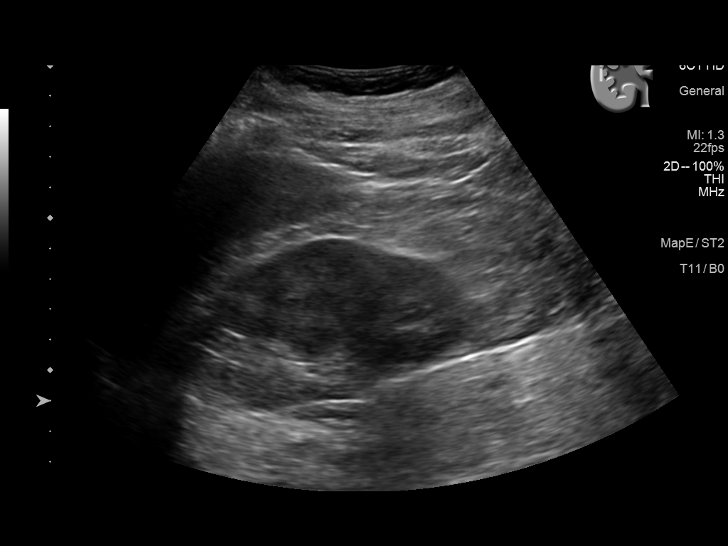
[im 17/51]
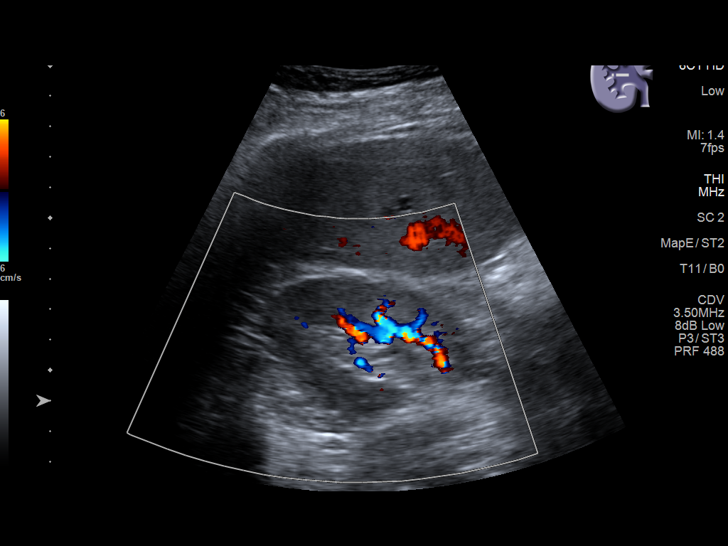
[im 19/51]
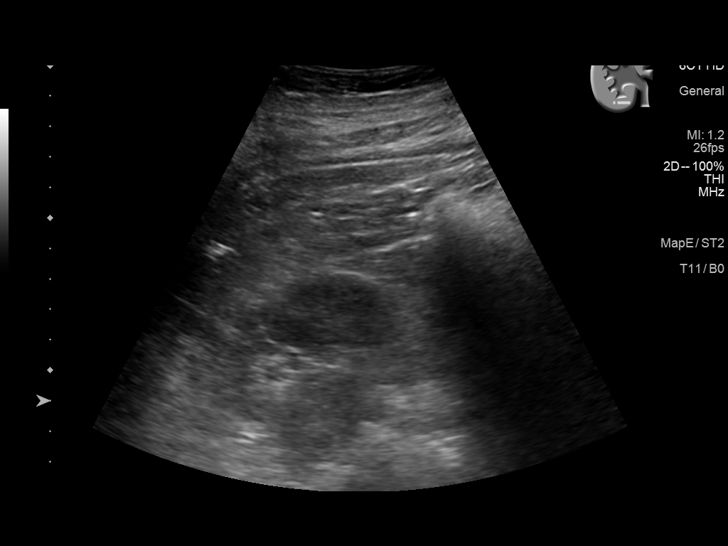
[im 23/51]
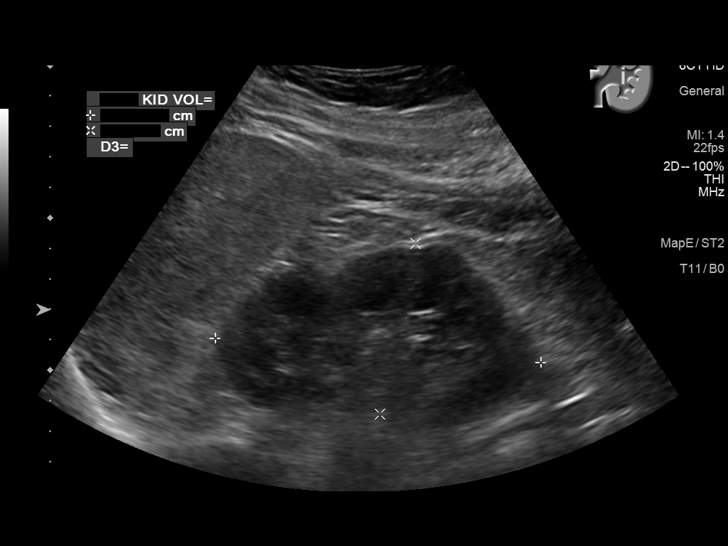
[im 28/51]
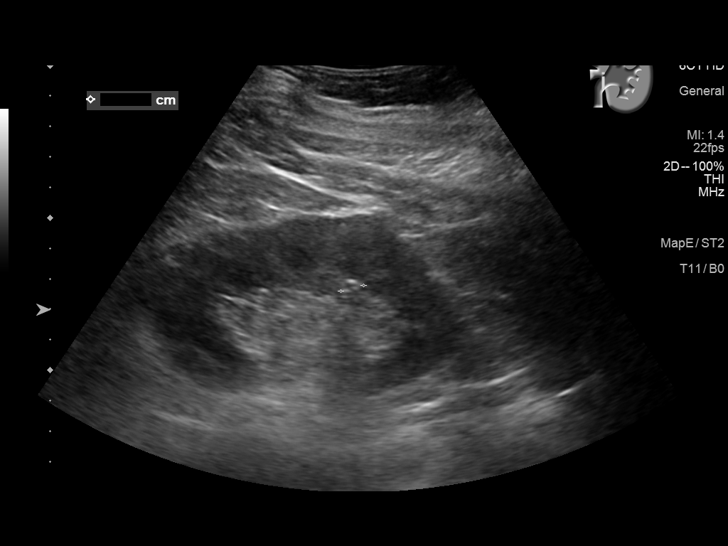
[im 32/51]
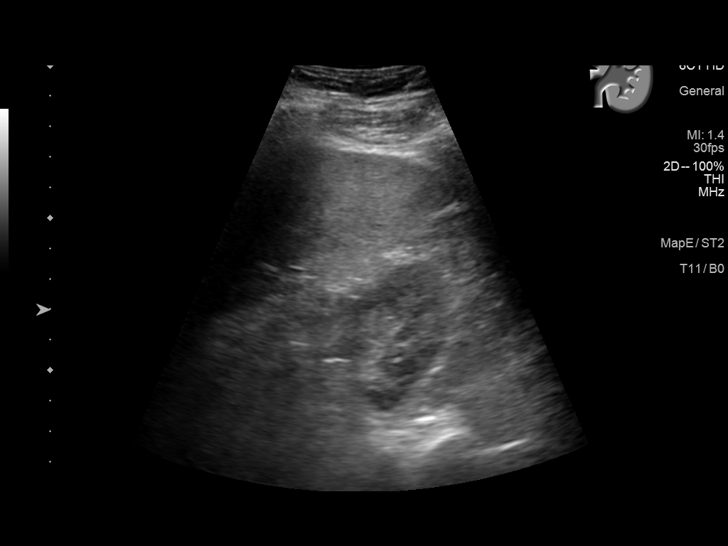
[im 34/51]
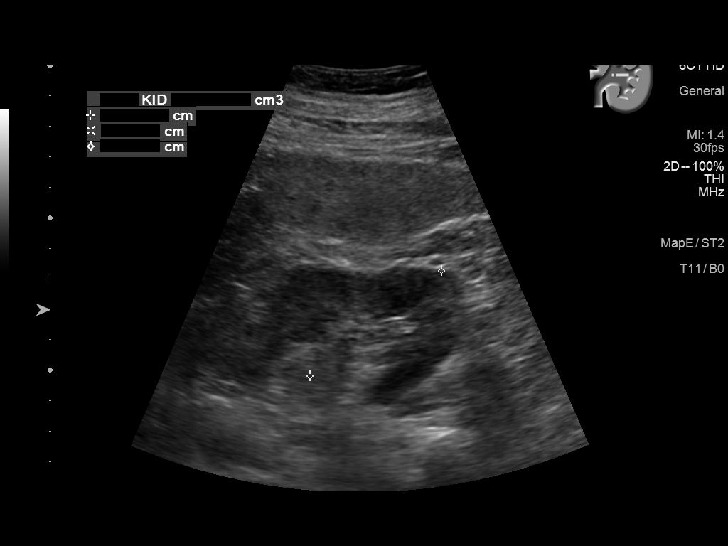
[im 38/51]
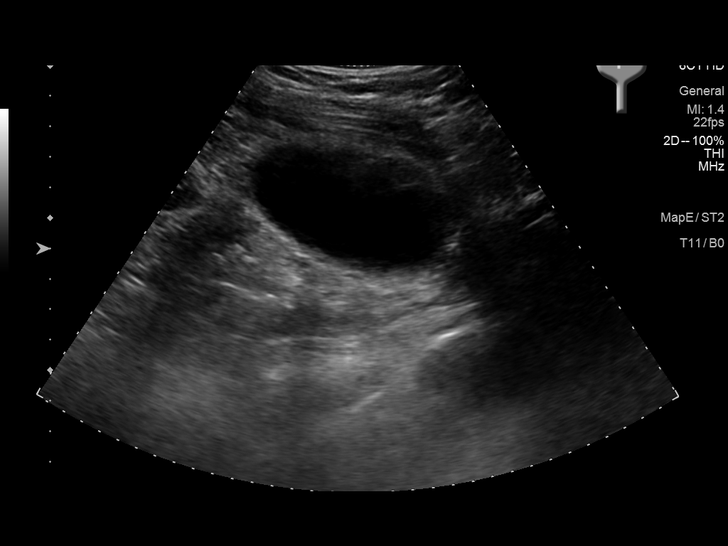
[im 42/51]
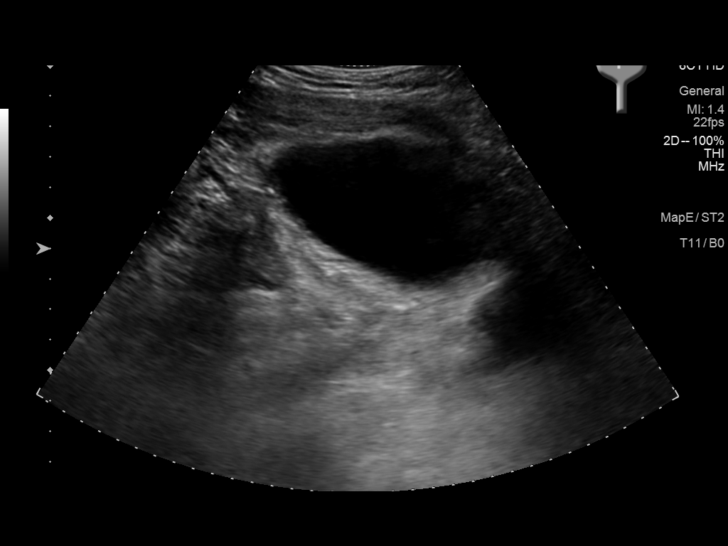
[im 46/51]
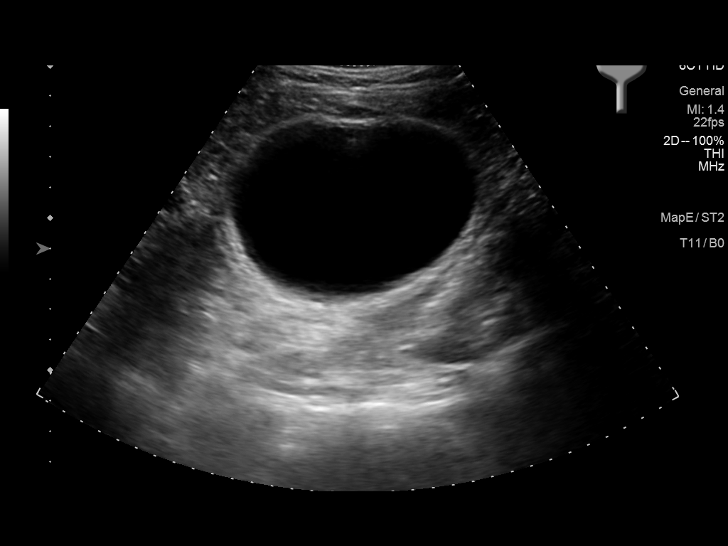
[im 51/51]
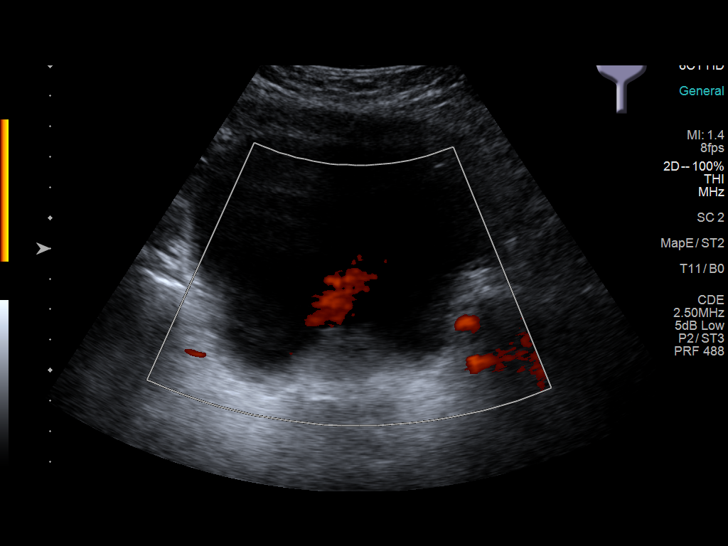

[14 of 25 positions shown; findings below may reference images not displayed]

FINDINGS: Right Kidney:

Renal measurements: 9.9 x 3.7 x 5.4 cm = volume: 104 mL. Normal
parenchymal echogenicity. Probable 6 mm nonobstructing stone in the
lower kidney. There is slight fullness of the right renal pelvis
without frank hydronephrosis or caliceal dilatation. No focal
lesion.

Left Kidney:

Renal measurements: 10.7 x 5.7 x 5.5 cm = volume: 178 mL. Normal
parenchymal echogenicity. No hydronephrosis. Probable 8 mm stone in
the mid kidney. No visualized focal lesion.

Bladder:

Appears normal for degree of bladder distention. Both ureteral jets
are demonstrated.

Other:

None.
IMPRESSION: 1. Probable bilateral intrarenal calculi.
2. No focal renal lesion.
